# Patient Record
Sex: Male | Born: 1945 | Race: White | Hispanic: No | Smoking: Former smoker
Health system: Southern US, Community
[De-identification: ages and names within clinical notes are randomized; demographics above are authoritative.]

## PROBLEM LIST (undated history)

## (undated) DIAGNOSIS — Z8711 Personal history of peptic ulcer disease: Secondary | ICD-10-CM

## (undated) DIAGNOSIS — N183 Chronic kidney disease, stage 3 unspecified: Secondary | ICD-10-CM

## (undated) DIAGNOSIS — I208 Other forms of angina pectoris: Secondary | ICD-10-CM

## (undated) DIAGNOSIS — I10 Essential (primary) hypertension: Secondary | ICD-10-CM

## (undated) DIAGNOSIS — K219 Gastro-esophageal reflux disease without esophagitis: Secondary | ICD-10-CM

## (undated) DIAGNOSIS — Z8719 Personal history of other diseases of the digestive system: Secondary | ICD-10-CM

## (undated) DIAGNOSIS — I739 Peripheral vascular disease, unspecified: Secondary | ICD-10-CM

## (undated) DIAGNOSIS — E785 Hyperlipidemia, unspecified: Secondary | ICD-10-CM

## (undated) DIAGNOSIS — I70301 Unspecified atherosclerosis of unspecified type of bypass graft(s) of the extremities, right leg: Secondary | ICD-10-CM

## (undated) DIAGNOSIS — I70211 Atherosclerosis of native arteries of extremities with intermittent claudication, right leg: Secondary | ICD-10-CM

## (undated) HISTORY — PX: EXCISIONAL HEMORRHOIDECTOMY: SHX1541

## (undated) HISTORY — DX: Unspecified atherosclerosis of unspecified type of bypass graft(s) of the extremities, right leg: I70.301

## (undated) HISTORY — DX: Gastro-esophageal reflux disease without esophagitis: K21.9

## (undated) HISTORY — DX: Atherosclerosis of native arteries of extremities with intermittent claudication, right leg: I70.211

## (undated) HISTORY — DX: Essential (primary) hypertension: I10

## (undated) HISTORY — PX: COLONOSCOPY W/ BIOPSIES AND POLYPECTOMY: SHX1376

---

## 2003-09-01 HISTORY — PX: FEMORAL ARTERY - FEMORAL ARTERY BYPASS GRAFT: SUR179

## 2013-02-06 DIAGNOSIS — E785 Hyperlipidemia, unspecified: Secondary | ICD-10-CM

## 2013-02-06 HISTORY — DX: Hyperlipidemia, unspecified: E78.5

## 2013-08-14 DIAGNOSIS — I739 Peripheral vascular disease, unspecified: Secondary | ICD-10-CM | POA: Diagnosis present

## 2014-07-31 ENCOUNTER — Encounter: Payer: Self-pay | Admitting: Vascular Surgery

## 2014-08-01 ENCOUNTER — Encounter: Payer: Self-pay | Admitting: Vascular Surgery

## 2014-08-02 ENCOUNTER — Ambulatory Visit (INDEPENDENT_AMBULATORY_CARE_PROVIDER_SITE_OTHER): Payer: Medicare Other | Admitting: Vascular Surgery

## 2014-08-02 ENCOUNTER — Other Ambulatory Visit: Payer: Self-pay

## 2014-08-02 ENCOUNTER — Encounter: Payer: Self-pay | Admitting: Vascular Surgery

## 2014-08-02 VITALS — BP 170/97 | HR 92 | Resp 18 | Ht 72.5 in | Wt 190.5 lb

## 2014-08-02 DIAGNOSIS — I739 Peripheral vascular disease, unspecified: Secondary | ICD-10-CM

## 2014-08-02 NOTE — Progress Notes (Signed)
Patient name: David KaufmanClyde Hupp MRN: [id number] DOB: 06/22/1946 Sex: male   Referred by: Georgiana ShoreLininger  Reason for referral:  Chief Complaint  Patient presents with  . 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 Harvel QualeLiniger   . PVD    HISTORY OF PRESENT ILLNESS: Patient is a very pleasant active 68 year old gentleman with a history 10 years ago of a left to right femoral-femoral bypass for right iliac occlusion. He done extremely well since that time. He reports that one month ago he had the acute onset of recurrent claudication in his right leg. He reports that he was putting up a deer stand and noticed recurrent difficulty. He has had claudication since that time but has had no rest pain and tissue loss. This is quite limiting to him as he is extremely active. He reports discomfort in his thigh and calf which is relieved with rest.  Past Medical History  Diagnosis Date  . Atherosclerosis of native artery of right lower extremity with intermittent claudication   . 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 Esophageal reflux   . Hypertension     Past Surgical History  Procedure Laterality Date  . Femoral artery - femoral artery bypass graft  2005    Left to Right     History   Social History  . Marital Status: Unknown    Spouse Name: N/A    Number of Children: N/A  . Years of Education: N/A   Occupational History  . Not on file.   Social History Main Topics  . Smoking status: Former Smoker    Quit date: 08/31/2000  . Smokeless tobacco: Never Used  . Alcohol Use: No  . Drug Use: No  . Sexual Activity: Not on file   Other Topics Concern  . Not on file   Social History Narrative    Family History  Problem Relation Age of Onset  . Deep vein thrombosis Sister     Blood Clots  . CVA Mother   . Heart disease Mother     Allergies as of 08/02/2014  . (No Known Allergies)    Current Outpatient  Prescriptions on File Prior to Visit  Medication Sig Dispense Refill  . AMLODIPINE BESYLATE PO Take 10 mg by mouth.    Marland Kitchen. aspirin 81 MG tablet Take 81 mg by mouth daily.    Marland Kitchen. CLOPIDOGREL BISULFATE PO Take 75 mg by mouth daily.    Marland Kitchen. LOSARTAN POTASSIUM PO Take 50 mg by mouth.    . Multiple Vitamins-Minerals (CENTRUM SILVER PO) Take by mouth daily.    . Omega-3 Fatty Acids (FISH OIL) 1000 MG CAPS Take 4,000 mg by mouth daily.     Marland Kitchen. omeprazole (PRILOSEC) 20 MG capsule Take 20 mg by mouth daily.     No current facility-administered medications on file prior to visit.     REVIEW OF SYSTEMS:  Positives indicated with an "X"  CARDIOVASCULAR:  [ ]  chest pain   [ ]  chest pressure   [ ]  palpitations   [ ]  orthopnea   [ ]  dyspnea on exertion   [x ] claudication   [ ]  rest pain   [ ]  DVT   [ ]  phlebitis PULMONARY:   [ ]  productive cough   [ ]  asthma   [ ]  wheezing NEUROLOGIC:   [x ] weakness  [ ]  paresthesias  [ ]  aphasia  [ ]   amaurosis  [ ] dizziness HEMATOLOGIC:   [ ] bleeding problems   [ ] clotting disorders MUSCULOSKELETAL:  [ ] joint pain   [ ] joint swelling GASTROINTESTINAL: [x ]  blood in stool  [ ]  hematemesis GENITOURINARY:  [ ]  dysuria  [ ]  hematuria PSYCHIATRIC:  [ ] history of major depression INTEGUMENTARY:  [ ] rashes  [ ] ulcers CONSTITUTIONAL:  [ ] fever   [ ] chills  PHYSICAL EXAMINATION:  General: The patient is a well-nourished male, in no acute distress. Vital signs are BP 170/97 mmHg  Pulse 92  Resp 18  Ht 6' 0.5" (1.842 m)  Wt 190 lb 8 oz (86.41 kg)  BMI 25.47 kg/m2 Pulmonary: There is a good air exchange bilaterally without wheezing or rales. Abdomen: Soft and non-tender with normal pitch bowel sounds. Musculoskeletal: There are no major deformities.  There is no significant extremity pain. Neurologic: No focal weakness or paresthesias are detected, Skin: There are no ulcer or rashes noted. Psychiatric: The patient has normal affect. Cardiovascular: There is  a regular rate and rhythm without significant murmur appreciated. Pulse status: 2+ radial pulses bilaterally. 2+ left femoral left popliteal and 2+ left dorsalis pedis pulse. Absent right femoral popliteal and absent right pedal pulses   Vascular Lab Studies:  I reviewed his noninvasive studies from 07/18/2014. This does show medical arm index of 0.50 on the right and normal at 1.0 on the left  Impression and Plan:  Right leg claudication with occlusion of prior paced left to right fem-fem bypass. Out of her long discussion with the patient and his wife present. Explained that there is a expected failure rate of fem-fem bypasses. Explain the expected 80% and your patency with these. He clearly has had reocclusion of this approximately one month ago. I have recommended arteriography for further evaluation. I explained that he may have inflow issues regarding his left iliac system but this does not appear to be the case but noninvasive studies and physical exam. Expected he will be an excellent candidate for redo left to right fem-fem bypass. He will undergo outpatient arteriography next week at time the hospital and we will schedule elective surgery following this.    EARLY, TODD [facility] [place] Office: [phone]         

## 2014-08-06 ENCOUNTER — Encounter (HOSPITAL_COMMUNITY)
Admission: RE | Disposition: A | Discharge status: Home / Self Care | Payer: Self-pay | Source: Ambulatory Visit | Attending: Vascular Surgery

## 2014-08-06 ENCOUNTER — Ambulatory Visit (HOSPITAL_COMMUNITY)
Admission: RE | Admit: 2014-08-06 | Discharge: 2014-08-06 | Disposition: A | Discharge status: Home / Self Care | Payer: Medicare Other | Source: Ambulatory Visit | Attending: Vascular Surgery | Admitting: Vascular Surgery

## 2014-08-06 DIAGNOSIS — Z7982 Long term (current) use of aspirin: Secondary | ICD-10-CM | POA: Insufficient documentation

## 2014-08-06 DIAGNOSIS — Z87891 Personal history of nicotine dependence: Secondary | ICD-10-CM | POA: Insufficient documentation

## 2014-08-06 DIAGNOSIS — K219 Gastro-esophageal reflux disease without esophagitis: Secondary | ICD-10-CM | POA: Insufficient documentation

## 2014-08-06 DIAGNOSIS — I779 Disorder of arteries and arterioles, unspecified: Secondary | ICD-10-CM | POA: Insufficient documentation

## 2014-08-06 DIAGNOSIS — I10 Essential (primary) hypertension: Secondary | ICD-10-CM | POA: Diagnosis not present

## 2014-08-06 DIAGNOSIS — I739 Peripheral vascular disease, unspecified: Secondary | ICD-10-CM | POA: Diagnosis present

## 2014-08-06 DIAGNOSIS — I70311 Atherosclerosis of unspecified type of bypass graft(s) of the extremities with intermittent claudication, right leg: Secondary | ICD-10-CM | POA: Insufficient documentation

## 2014-08-06 DIAGNOSIS — T82898A Other specified complication of vascular prosthetic devices, implants and grafts, initial encounter: Secondary | ICD-10-CM

## 2014-08-06 HISTORY — PX: ABDOMINAL AORTAGRAM: SHX5454

## 2014-08-06 LAB — POCT I-STAT, CHEM 8
BUN: 20 mg/dL (ref 6–23)
CHLORIDE: 108 meq/L (ref 96–112)
CREATININE: 1.4 mg/dL — AB (ref 0.50–1.35)
Calcium, Ion: 1.22 mmol/L (ref 1.13–1.30)
Glucose, Bld: 106 mg/dL — ABNORMAL HIGH (ref 70–99)
HEMATOCRIT: 44 % (ref 39.0–52.0)
Hemoglobin: 15 g/dL (ref 13.0–17.0)
POTASSIUM: 3.9 meq/L (ref 3.7–5.3)
Sodium: 145 mEq/L (ref 137–147)
TCO2: 23 mmol/L (ref 0–100)

## 2014-08-06 SURGERY — ABDOMINAL AORTAGRAM
Anesthesia: LOCAL

## 2014-08-06 MED ORDER — HEPARIN (PORCINE) IN NACL 2-0.9 UNIT/ML-% IJ SOLN
INTRAMUSCULAR | Status: AC
  Filled 2014-08-06: qty 1000

## 2014-08-06 MED ORDER — OXYCODONE-ACETAMINOPHEN 5-325 MG PO TABS: 1.0000 | ORAL_TABLET | ORAL | Status: DC | PRN

## 2014-08-06 MED ORDER — LIDOCAINE HCL (PF) 1 % IJ SOLN
INTRAMUSCULAR | Status: AC
  Filled 2014-08-06: qty 30

## 2014-08-06 MED ORDER — FENTANYL CITRATE 0.05 MG/ML IJ SOLN
INTRAMUSCULAR | Status: AC
  Filled 2014-08-06: qty 2

## 2014-08-06 MED ORDER — SODIUM CHLORIDE 0.9 % IV SOLN: 1.0000 mL/kg/h | INTRAVENOUS | Status: DC

## 2014-08-06 MED ORDER — MIDAZOLAM HCL 2 MG/2ML IJ SOLN
INTRAMUSCULAR | Status: AC
  Filled 2014-08-06: qty 2

## 2014-08-06 MED ORDER — SODIUM CHLORIDE 0.9 % IV SOLN: INTRAVENOUS | Status: DC

## 2014-08-06 NOTE — Discharge Instructions (Signed)

## 2014-08-06 NOTE — H&P (View-Only) (Signed)
Patient name: David KaufmanClyde Horne MRN: [id number] DOB: 06/22/1946 Sex: male   Referred by: Georgiana ShoreLininger  Reason for referral:  Chief Complaint  Patient presents with  . 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 Harvel QualeLiniger   . PVD    HISTORY OF PRESENT ILLNESS: Patient is a very pleasant active 68 year old gentleman with a history 10 years ago of a left to right femoral-femoral bypass for right iliac occlusion. He done extremely well since that time. He reports that one month ago he had the acute onset of recurrent claudication in his right leg. He reports that he was putting up a deer stand and noticed recurrent difficulty. He has had claudication since that time but has had no rest pain and tissue loss. This is quite limiting to him as he is extremely active. He reports discomfort in his thigh and calf which is relieved with rest.  Past Medical History  Diagnosis Date  . Atherosclerosis of native artery of right lower extremity with intermittent claudication   . 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 Esophageal reflux   . Hypertension     Past Surgical History  Procedure Laterality Date  . Femoral artery - femoral artery bypass graft  2005    Left to Right     History   Social History  . Marital Status: Unknown    Spouse Name: N/A    Number of Children: N/A  . Years of Education: N/A   Occupational History  . Not on file.   Social History Main Topics  . Smoking status: Former Smoker    Quit date: 08/31/2000  . Smokeless tobacco: Never Used  . Alcohol Use: No  . Drug Use: No  . Sexual Activity: Not on file   Other Topics Concern  . Not on file   Social History Narrative    Family History  Problem Relation Age of Onset  . Deep vein thrombosis Sister     Blood Clots  . CVA Mother   . Heart disease Mother     Allergies as of 08/02/2014  . (No Known Allergies)    Current Outpatient  Prescriptions on File Prior to Visit  Medication Sig Dispense Refill  . AMLODIPINE BESYLATE PO Take 10 mg by mouth.    Marland Kitchen. aspirin 81 MG tablet Take 81 mg by mouth daily.    Marland Kitchen. CLOPIDOGREL BISULFATE PO Take 75 mg by mouth daily.    Marland Kitchen. LOSARTAN POTASSIUM PO Take 50 mg by mouth.    . Multiple Vitamins-Minerals (CENTRUM SILVER PO) Take by mouth daily.    . Omega-3 Fatty Acids (FISH OIL) 1000 MG CAPS Take 4,000 mg by mouth daily.     Marland Kitchen. omeprazole (PRILOSEC) 20 MG capsule Take 20 mg by mouth daily.     No current facility-administered medications on file prior to visit.     REVIEW OF SYSTEMS:  Positives indicated with an "X"  CARDIOVASCULAR:  [ ]  chest pain   [ ]  chest pressure   [ ]  palpitations   [ ]  orthopnea   [ ]  dyspnea on exertion   [x ] claudication   [ ]  rest pain   [ ]  DVT   [ ]  phlebitis PULMONARY:   [ ]  productive cough   [ ]  asthma   [ ]  wheezing NEUROLOGIC:   [x ] weakness  [ ]  paresthesias  [ ]  aphasia  [ ]   amaurosis  [ ]  dizziness HEMATOLOGIC:   [ ]  bleeding problems   [ ]  clotting disorders MUSCULOSKELETAL:  [ ]  joint pain   [ ]  joint swelling GASTROINTESTINAL: [x ]  blood in stool  [ ]   hematemesis GENITOURINARY:  [ ]   dysuria  [ ]   hematuria PSYCHIATRIC:  [ ]  history of major depression INTEGUMENTARY:  [ ]  rashes  [ ]  ulcers CONSTITUTIONAL:  [ ]  fever   [ ]  chills  PHYSICAL EXAMINATION:  General: The patient is a well-nourished male, in no acute distress. Vital signs are BP 170/97 mmHg  Pulse 92  Resp 18  Ht 6' 0.5" (1.842 m)  Wt 190 lb 8 oz (86.41 kg)  BMI 25.47 kg/m2 Pulmonary: There is a good air exchange bilaterally without wheezing or rales. Abdomen: Soft and non-tender with normal pitch bowel sounds. Musculoskeletal: There are no major deformities.  There is no significant extremity pain. Neurologic: No focal weakness or paresthesias are detected, Skin: There are no ulcer or rashes noted. Psychiatric: The patient has normal affect. Cardiovascular: There is  a regular rate and rhythm without significant murmur appreciated. Pulse status: 2+ radial pulses bilaterally. 2+ left femoral left popliteal and 2+ left dorsalis pedis pulse. Absent right femoral popliteal and absent right pedal pulses   Vascular Lab Studies:  I reviewed his noninvasive studies from 07/18/2014. This does show medical arm index of 0.50 on the right and normal at 1.0 on the left  Impression and Plan:  Right leg claudication with occlusion of prior paced left to right fem-fem bypass. Out of her long discussion with the patient and his wife present. Explained that there is a expected failure rate of fem-fem bypasses. Explain the expected 80% and your patency with these. He clearly has had reocclusion of this approximately one month ago. I have recommended arteriography for further evaluation. I explained that he may have inflow issues regarding his left iliac system but this does not appear to be the case but noninvasive studies and physical exam. Expected he will be an excellent candidate for redo left to right fem-fem bypass. He will undergo outpatient arteriography next week at time the hospital and we will schedule elective surgery following this.    David Horne [facility] [place] Office: [phone]

## 2014-08-06 NOTE — Op Note (Signed)
   David Horne: [name]   MRN: [id number] DOB: 01/19/1946    DATE OF PROCEDURE: 08/06/2014  INDICATIONS: David KaufmanClyde Treat is a 68 y.o. male who presented with an occluded left-to-right femorofemoral bypass graft. He was set up for a diagnostic arteriogram in order to plan for redo femorofemoral bypass grafting.  PROCEDURE:  1. Ultrasound-guided access to the left common femoral artery 2. Aortogram with bilateral iliac arteriogram and bilateral lower extremity runoff  SURGEON: Di Kindlehristopher S. Edilia Boickson, MD, FACS  ANESTHESIA: local with sedation   EBL: minimal  TECHNIQUE: The patient was taken to the peripheral vascular lab and received 1 mg of Versed and 50 g of fentanyl. Both groins were prepped and draped in usual sterile fashion. Under ultrasound guidance, after the skin was anesthetized, I attempted to cannulate the common femoral artery above the anastomosis however was difficult to get an appropriate angle because of the overlying graft. I was able to cannulate the artery above the graft but the wire would not pass and therefore I held pressure and elected to cannulate the anastomosis through the graft. Under ultrasound guidance, after the skin was anesthetized, the micropuncture needle was used to cannulate the native common femoral artery through the graft. The micropuncture sheath was introduced over a wire and then the micropuncture sheath exchanged for a 5 French sheath over a versa core wire. The pigtail catheter was positioned at the L1 vertebral body and flush aortogram obtained. The catheter was in position above the aortic bifurcation and oblique iliac projection was obtained. Next bilateral lower extremity runoff films were obtained. At the completion of the procedure the pigtail catheter was removed over a wire. The sheath was removed and pressure held for hemostasis. No immediate complications were noted.  FINDINGS:  1. There are single renal arteries bilaterally with no significant renal  artery stenosis identified. 2. The infrarenal aorta is patent. The left common iliac artery is patent although the right common iliac artery is occluded. The right external iliac artery is occluded. 3. On the right side, which is the symptomatic side, there is reconstitution of the distal common femoral artery. The right deep femoral artery, superficial femoral artery, popliteal artery, and proximal anterior tibial, posterior tibial, tibial peroneal trunk, and peroneal arteries are patent. There is poor visualization of the right leg distally because of the proximal occlusion. 4. On the left side, the common iliac artery and external iliac arteries are widely patent. The left common femoral, deep femoral, superficial femoral, popliteal, anterior tibial, posterior tibial, tibial peroneal trunk, and peroneal arteries are patent.  [name], MD, FACS [facility]  DATE OF DICTATION:   08/06/2014

## 2014-08-06 NOTE — Interval H&P Note (Signed)
History and Physical Interval Note:  08/06/2014 10:19 AM  David Horne  has presented today for surgery, with the diagnosis of right illac acclution  The various methods of treatment have been discussed with the patient and family. After consideration of risks, benefits and other options for treatment, the patient has consented to  Procedure(s): ABDOMINAL AORTAGRAM (N/A) as a surgical intervention . His airway is a 2. The patient's history has been reviewed, patient examined, no change in status, stable for surgery.  I have reviewed the patient's chart and labs.  Questions were answered to the patient's satisfaction.     DICKSON,CHRISTOPHER S

## 2014-08-09 ENCOUNTER — Encounter (HOSPITAL_COMMUNITY): Payer: Self-pay | Admitting: Vascular Surgery

## 2014-08-14 ENCOUNTER — Other Ambulatory Visit: Payer: Self-pay

## 2014-08-15 ENCOUNTER — Other Ambulatory Visit: Payer: Self-pay

## 2014-08-21 ENCOUNTER — Encounter: Payer: Self-pay | Admitting: Vascular Surgery

## 2014-09-03 ENCOUNTER — Other Ambulatory Visit (HOSPITAL_COMMUNITY): Payer: Self-pay | Admitting: *Deleted

## 2014-09-03 NOTE — Pre-Procedure Instructions (Signed)
Mohmed Farver  09/03/2014   Your procedure is scheduled on:  Monday, September 10, 2014 at 7:30 AM.   Report to [facility] Entrance "A" Admitting Office at 5:30 AM.   Call this number if you have problems the morning of surgery: [phone]               Any questions prior to day of surgery, please call [phone] between 8 & 4 PM.   Remember:   Do not eat food or drink liquids after midnight Sunday, 09/09/14.   Take these medicines the morning of surgery with A SIP OF WATER: amLODipine (NORVASC), omeprazole (PRILOSEC).  Stop Aspirin, Plavix, Vitamins, and Fish Oil as of today.   Do not wear jewelry.  Do not wear lotions, powders, or cologne. You may wear deodorant.  Men may shave face and neck.  Do not bring valuables to the hospital.  [facility] is not responsible                  for any belongings or valuables.               Contacts, dentures or bridgework may not be worn into surgery.  Leave suitcase in the car. After surgery it may be brought to your room.  For patients admitted to the hospital, discharge time is determined by your                treatment team.          Special Instructions: See "Preparing for Surgery" Instruction Sheet.   Please read over the following fact sheets that you were given: Pain Booklet, Coughing and Deep Breathing, Blood Transfusion Information, MRSA Information and Surgical Site Infection Prevention

## 2014-09-04 ENCOUNTER — Encounter (HOSPITAL_COMMUNITY)
Admission: RE | Admit: 2014-09-04 | Discharge: 2014-09-04 | Disposition: A | Discharge status: Home / Self Care | Payer: Medicare Other | Source: Ambulatory Visit | Attending: Vascular Surgery | Admitting: Vascular Surgery

## 2014-09-04 ENCOUNTER — Encounter (HOSPITAL_COMMUNITY): Payer: Self-pay

## 2014-09-04 DIAGNOSIS — Z01812 Encounter for preprocedural laboratory examination: Secondary | ICD-10-CM | POA: Insufficient documentation

## 2014-09-04 DIAGNOSIS — I70312 Atherosclerosis of unspecified type of bypass graft(s) of the extremities with intermittent claudication, left leg: Secondary | ICD-10-CM | POA: Insufficient documentation

## 2014-09-04 DIAGNOSIS — Z7902 Long term (current) use of antithrombotics/antiplatelets: Secondary | ICD-10-CM | POA: Diagnosis not present

## 2014-09-04 DIAGNOSIS — I10 Essential (primary) hypertension: Secondary | ICD-10-CM | POA: Diagnosis not present

## 2014-09-04 DIAGNOSIS — Z79899 Other long term (current) drug therapy: Secondary | ICD-10-CM | POA: Insufficient documentation

## 2014-09-04 LAB — COMPREHENSIVE METABOLIC PANEL
ALT: 35 U/L (ref 0–53)
ANION GAP: 9 (ref 5–15)
AST: 26 U/L (ref 0–37)
Albumin: 3.9 g/dL (ref 3.5–5.2)
Alkaline Phosphatase: 116 U/L (ref 39–117)
BUN: 14 mg/dL (ref 6–23)
CALCIUM: 9.4 mg/dL (ref 8.4–10.5)
CO2: 24 mmol/L (ref 19–32)
CREATININE: 1.59 mg/dL — AB (ref 0.50–1.35)
Chloride: 108 mEq/L (ref 96–112)
GFR calc Af Amer: 50 mL/min — ABNORMAL LOW (ref >90)
GFR, EST NON AFRICAN AMERICAN: 43 mL/min — AB (ref >90)
Glucose, Bld: 94 mg/dL (ref 70–99)
POTASSIUM: 3.7 mmol/L (ref 3.5–5.1)
Sodium: 141 mmol/L (ref 135–145)
Total Bilirubin: 0.5 mg/dL (ref 0.3–1.2)
Total Protein: 7.5 g/dL (ref 6.0–8.3)

## 2014-09-04 LAB — CBC
HCT: 46.5 % (ref 39.0–52.0)
Hemoglobin: 16 g/dL (ref 13.0–17.0)
MCH: 30.2 pg (ref 26.0–34.0)
MCHC: 34.4 g/dL (ref 30.0–36.0)
MCV: 87.9 fL (ref 78.0–100.0)
Platelets: 260 10*3/uL (ref 150–400)
RBC: 5.29 MIL/uL (ref 4.22–5.81)
RDW: 13.3 % (ref 11.5–15.5)
WBC: 7.4 10*3/uL (ref 4.0–10.5)

## 2014-09-04 LAB — URINALYSIS, ROUTINE W REFLEX MICROSCOPIC
BILIRUBIN URINE: NEGATIVE
Glucose, UA: NEGATIVE mg/dL
Hgb urine dipstick: NEGATIVE
KETONES UR: NEGATIVE mg/dL
LEUKOCYTES UA: NEGATIVE
Nitrite: NEGATIVE
PH: 6.5 (ref 5.0–8.0)
Protein, ur: NEGATIVE mg/dL
SPECIFIC GRAVITY, URINE: 1.019 (ref 1.005–1.030)
UROBILINOGEN UA: 1 mg/dL (ref 0.0–1.0)

## 2014-09-04 LAB — PROTIME-INR
INR: 1.03 (ref 0.00–1.49)
Prothrombin Time: 13.6 seconds (ref 11.6–15.2)

## 2014-09-04 LAB — TYPE AND SCREEN
ABO/RH(D): A POS
Antibody Screen: NEGATIVE

## 2014-09-04 LAB — ABO/RH: ABO/RH(D): A POS

## 2014-09-04 LAB — SURGICAL PCR SCREEN
MRSA, PCR: NEGATIVE
STAPHYLOCOCCUS AUREUS: NEGATIVE

## 2014-09-04 LAB — APTT: aPTT: 27 seconds (ref 24–37)

## 2014-09-04 NOTE — Progress Notes (Signed)
LEFT MESSAGE FOR DR. Bosie HelperEARLY'S OFFICE TO INSTRUCT PATIENT ON WHETHER TO STOP ASPIRIN AND PATIENT STATED HE WAS INSTRUCTED TO STOP PLAVIX TODAY.

## 2014-09-09 MED ORDER — DEXTROSE 5 % IV SOLN
1.5000 g | INTRAVENOUS | Status: AC
  Administered 2014-09-10: 1.5 g via INTRAVENOUS
  Filled 2014-09-09: qty 1.5

## 2014-09-10 ENCOUNTER — Encounter (HOSPITAL_COMMUNITY): Payer: Self-pay | Admitting: *Deleted

## 2014-09-10 ENCOUNTER — Encounter (HOSPITAL_COMMUNITY)
Admission: RE | Disposition: A | Discharge status: Home / Self Care | Payer: Self-pay | Source: Ambulatory Visit | Attending: Vascular Surgery

## 2014-09-10 ENCOUNTER — Inpatient Hospital Stay (HOSPITAL_COMMUNITY)
Admission: RE | Admit: 2014-09-10 | Discharge: 2014-09-12 | DRG: 253 | Disposition: A | Discharge status: Home / Self Care | Payer: Medicare Other | Source: Ambulatory Visit | Attending: Vascular Surgery | Admitting: Vascular Surgery

## 2014-09-10 ENCOUNTER — Inpatient Hospital Stay (HOSPITAL_COMMUNITY): Payer: Medicare Other | Admitting: Anesthesiology

## 2014-09-10 DIAGNOSIS — Z87891 Personal history of nicotine dependence: Secondary | ICD-10-CM | POA: Diagnosis not present

## 2014-09-10 DIAGNOSIS — I70618 Atherosclerosis of nonbiological bypass graft(s) of the extremities with intermittent claudication, other extremity: Principal | ICD-10-CM | POA: Diagnosis present

## 2014-09-10 DIAGNOSIS — I7092 Chronic total occlusion of artery of the extremities: Secondary | ICD-10-CM | POA: Diagnosis present

## 2014-09-10 DIAGNOSIS — I70411 Atherosclerosis of autologous vein bypass graft(s) of the extremities with intermittent claudication, right leg: Secondary | ICD-10-CM

## 2014-09-10 DIAGNOSIS — I10 Essential (primary) hypertension: Secondary | ICD-10-CM | POA: Diagnosis present

## 2014-09-10 DIAGNOSIS — K219 Gastro-esophageal reflux disease without esophagitis: Secondary | ICD-10-CM | POA: Diagnosis present

## 2014-09-10 DIAGNOSIS — T82898A Other specified complication of vascular prosthetic devices, implants and grafts, initial encounter: Secondary | ICD-10-CM | POA: Diagnosis present

## 2014-09-10 HISTORY — PX: FEMORAL-FEMORAL BYPASS GRAFT: SHX936

## 2014-09-10 LAB — CBC
HCT: 37.1 % — ABNORMAL LOW (ref 39.0–52.0)
Hemoglobin: 12.6 g/dL — ABNORMAL LOW (ref 13.0–17.0)
MCH: 29.4 pg (ref 26.0–34.0)
MCHC: 34 g/dL (ref 30.0–36.0)
MCV: 86.7 fL (ref 78.0–100.0)
Platelets: 229 10*3/uL (ref 150–400)
RBC: 4.28 MIL/uL (ref 4.22–5.81)
RDW: 13.2 % (ref 11.5–15.5)
WBC: 11.1 10*3/uL — AB (ref 4.0–10.5)

## 2014-09-10 LAB — CREATININE, SERUM
CREATININE: 1.36 mg/dL — AB (ref 0.50–1.35)
GFR, EST AFRICAN AMERICAN: 60 mL/min — AB (ref >90)
GFR, EST NON AFRICAN AMERICAN: 52 mL/min — AB (ref >90)

## 2014-09-10 SURGERY — CREATION, BYPASS, ARTERIAL, FEMORAL TO FEMORAL, USING GRAFT
Anesthesia: General | Site: Groin | Laterality: Bilateral

## 2014-09-10 MED ORDER — LIDOCAINE HCL (CARDIAC) 20 MG/ML IV SOLN
INTRAVENOUS | Status: AC
  Filled 2014-09-10: qty 5

## 2014-09-10 MED ORDER — NEOSTIGMINE METHYLSULFATE 10 MG/10ML IV SOLN
INTRAVENOUS | Status: DC | PRN
Start: 2014-09-10 — End: 2014-09-10
  Administered 2014-09-10: 3 mg via INTRAVENOUS

## 2014-09-10 MED ORDER — FENTANYL CITRATE 0.05 MG/ML IJ SOLN
INTRAMUSCULAR | Status: DC | PRN
  Administered 2014-09-10: 50 ug via INTRAVENOUS
  Administered 2014-09-10: 100 ug via INTRAVENOUS
  Administered 2014-09-10 (×2): 25 ug via INTRAVENOUS

## 2014-09-10 MED ORDER — NEOSTIGMINE METHYLSULFATE 10 MG/10ML IV SOLN
INTRAVENOUS | Status: AC
  Filled 2014-09-10: qty 1

## 2014-09-10 MED ORDER — HYDROMORPHONE HCL 1 MG/ML IJ SOLN
0.2500 mg | INTRAMUSCULAR | Status: DC | PRN
  Administered 2014-09-10: 0.5 mg via INTRAVENOUS

## 2014-09-10 MED ORDER — HEPARIN SODIUM (PORCINE) 1000 UNIT/ML IJ SOLN
INTRAMUSCULAR | Status: AC
  Filled 2014-09-10: qty 1

## 2014-09-10 MED ORDER — ENOXAPARIN SODIUM 30 MG/0.3ML ~~LOC~~ SOLN
30.0000 mg | SUBCUTANEOUS | Status: DC
  Administered 2014-09-11 – 2014-09-12 (×2): 30 mg via SUBCUTANEOUS
  Filled 2014-09-10 (×2): qty 0.3

## 2014-09-10 MED ORDER — PROPOFOL 10 MG/ML IV BOLUS
INTRAVENOUS | Status: AC
  Filled 2014-09-10: qty 20

## 2014-09-10 MED ORDER — LABETALOL HCL 5 MG/ML IV SOLN
10.0000 mg | INTRAVENOUS | Status: DC | PRN
  Filled 2014-09-10: qty 4

## 2014-09-10 MED ORDER — PHENOL 1.4 % MT LIQD
1.0000 | OROMUCOSAL | Status: DC | PRN
  Filled 2014-09-10: qty 177

## 2014-09-10 MED ORDER — SODIUM CHLORIDE 0.9 % IR SOLN
Status: DC | PRN
  Administered 2014-09-10: 500 mL

## 2014-09-10 MED ORDER — POTASSIUM CHLORIDE CRYS ER 20 MEQ PO TBCR
20.0000 meq | EXTENDED_RELEASE_TABLET | Freq: Every day | ORAL | Status: DC | PRN
Start: 2014-09-10 — End: 2014-09-12

## 2014-09-10 MED ORDER — POLYETHYLENE GLYCOL 3350 17 G PO PACK
17.0000 g | PACK | Freq: Every day | ORAL | Status: DC | PRN
  Filled 2014-09-10: qty 1

## 2014-09-10 MED ORDER — DOCUSATE SODIUM 100 MG PO CAPS
100.0000 mg | ORAL_CAPSULE | Freq: Every day | ORAL | Status: DC
  Administered 2014-09-11 – 2014-09-12 (×2): 100 mg via ORAL
  Filled 2014-09-10 (×2): qty 1

## 2014-09-10 MED ORDER — CLOPIDOGREL BISULFATE 75 MG PO TABS
75.0000 mg | ORAL_TABLET | Freq: Every day | ORAL | Status: DC
  Administered 2014-09-10 – 2014-09-12 (×3): 75 mg via ORAL
  Filled 2014-09-10 (×3): qty 1

## 2014-09-10 MED ORDER — SODIUM CHLORIDE 0.9 % IV SOLN: 500.0000 mL | Freq: Once | INTRAVENOUS | Status: AC | PRN

## 2014-09-10 MED ORDER — AMLODIPINE BESYLATE 10 MG PO TABS
10.0000 mg | ORAL_TABLET | Freq: Every day | ORAL | Status: DC
Start: 2014-09-11 — End: 2014-09-12
  Administered 2014-09-11 – 2014-09-12 (×2): 10 mg via ORAL
  Filled 2014-09-10 (×2): qty 1

## 2014-09-10 MED ORDER — SODIUM CHLORIDE 0.9 % IV SOLN: INTRAVENOUS | Status: DC

## 2014-09-10 MED ORDER — CHLORHEXIDINE GLUCONATE CLOTH 2 % EX PADS: 6.0000 | MEDICATED_PAD | Freq: Once | CUTANEOUS | Status: DC

## 2014-09-10 MED ORDER — PROTAMINE SULFATE 10 MG/ML IV SOLN
INTRAVENOUS | Status: DC | PRN
  Administered 2014-09-10: 20 mg via INTRAVENOUS
  Administered 2014-09-10: 30 mg via INTRAVENOUS

## 2014-09-10 MED ORDER — MORPHINE SULFATE 2 MG/ML IJ SOLN
2.0000 mg | INTRAMUSCULAR | Status: DC | PRN
Start: 2014-09-10 — End: 2014-09-12

## 2014-09-10 MED ORDER — ASPIRIN 81 MG PO CHEW
81.0000 mg | CHEWABLE_TABLET | Freq: Every day | ORAL | Status: DC
  Administered 2014-09-11 – 2014-09-12 (×2): 81 mg via ORAL
  Filled 2014-09-10 (×2): qty 1

## 2014-09-10 MED ORDER — OXYCODONE-ACETAMINOPHEN 5-325 MG PO TABS
1.0000 | ORAL_TABLET | ORAL | Status: DC | PRN
  Administered 2014-09-11: 2 via ORAL
  Filled 2014-09-10: qty 2

## 2014-09-10 MED ORDER — HEPARIN SODIUM (PORCINE) 1000 UNIT/ML IJ SOLN
INTRAMUSCULAR | Status: DC | PRN
  Administered 2014-09-10: 8000 [IU] via INTRAVENOUS

## 2014-09-10 MED ORDER — MIDAZOLAM HCL 2 MG/2ML IJ SOLN
INTRAMUSCULAR | Status: AC
  Filled 2014-09-10: qty 2

## 2014-09-10 MED ORDER — DEXTROSE 5 % IV SOLN
1.5000 g | Freq: Two times a day (BID) | INTRAVENOUS | Status: AC
  Administered 2014-09-11: 1.5 g via INTRAVENOUS
  Filled 2014-09-10 (×3): qty 1.5

## 2014-09-10 MED ORDER — ASPIRIN 81 MG PO TABS
81.0000 mg | ORAL_TABLET | Freq: Every day | ORAL | Status: DC
  Filled 2014-09-10: qty 1

## 2014-09-10 MED ORDER — PANTOPRAZOLE SODIUM 40 MG PO TBEC
40.0000 mg | DELAYED_RELEASE_TABLET | Freq: Every day | ORAL | Status: DC
  Administered 2014-09-10 – 2014-09-12 (×3): 40 mg via ORAL
  Filled 2014-09-10 (×2): qty 1

## 2014-09-10 MED ORDER — BISACODYL 10 MG RE SUPP: 10.0000 mg | Freq: Every day | RECTAL | Status: DC | PRN

## 2014-09-10 MED ORDER — HYDROMORPHONE HCL 1 MG/ML IJ SOLN
INTRAMUSCULAR | Status: AC
  Filled 2014-09-10: qty 1

## 2014-09-10 MED ORDER — LIDOCAINE HCL (CARDIAC) 20 MG/ML IV SOLN
INTRAVENOUS | Status: DC | PRN
  Administered 2014-09-10: 70 mg via INTRAVENOUS

## 2014-09-10 MED ORDER — METOPROLOL TARTRATE 1 MG/ML IV SOLN: 2.0000 mg | INTRAVENOUS | Status: DC | PRN

## 2014-09-10 MED ORDER — GLYCOPYRROLATE 0.2 MG/ML IJ SOLN
INTRAMUSCULAR | Status: AC
  Filled 2014-09-10: qty 2

## 2014-09-10 MED ORDER — GUAIFENESIN-DM 100-10 MG/5ML PO SYRP: 15.0000 mL | ORAL_SOLUTION | ORAL | Status: DC | PRN

## 2014-09-10 MED ORDER — ALUM & MAG HYDROXIDE-SIMETH 200-200-20 MG/5ML PO SUSP: 15.0000 mL | ORAL | Status: DC | PRN

## 2014-09-10 MED ORDER — 0.9 % SODIUM CHLORIDE (POUR BTL) OPTIME
TOPICAL | Status: DC | PRN
  Administered 2014-09-10 (×2): 1000 mL

## 2014-09-10 MED ORDER — ACETAMINOPHEN 325 MG PO TABS
325.0000 mg | ORAL_TABLET | ORAL | Status: DC | PRN
Start: 2014-09-10 — End: 2014-09-12

## 2014-09-10 MED ORDER — ONDANSETRON HCL 4 MG/2ML IJ SOLN
INTRAMUSCULAR | Status: AC
  Filled 2014-09-10: qty 2

## 2014-09-10 MED ORDER — GLYCOPYRROLATE 0.2 MG/ML IJ SOLN
INTRAMUSCULAR | Status: DC | PRN
  Administered 2014-09-10: 0.4 mg via INTRAVENOUS

## 2014-09-10 MED ORDER — HYDRALAZINE HCL 20 MG/ML IJ SOLN: 5.0000 mg | INTRAMUSCULAR | Status: DC | PRN

## 2014-09-10 MED ORDER — ONDANSETRON HCL 4 MG/2ML IJ SOLN
INTRAMUSCULAR | Status: DC | PRN
  Administered 2014-09-10: 4 mg via INTRAVENOUS

## 2014-09-10 MED ORDER — LACTATED RINGERS IV SOLN
INTRAVENOUS | Status: DC | PRN
  Administered 2014-09-10: 07:00:00 via INTRAVENOUS

## 2014-09-10 MED ORDER — PHENYLEPHRINE HCL 10 MG/ML IJ SOLN
10.0000 mg | INTRAMUSCULAR | Status: DC | PRN
  Administered 2014-09-10: 10 ug/min via INTRAVENOUS

## 2014-09-10 MED ORDER — ACETAMINOPHEN 650 MG RE SUPP: 325.0000 mg | RECTAL | Status: DC | PRN

## 2014-09-10 MED ORDER — MAGNESIUM SULFATE 2 GM/50ML IV SOLN
2.0000 g | Freq: Every day | INTRAVENOUS | Status: DC | PRN
  Filled 2014-09-10: qty 50

## 2014-09-10 MED ORDER — FENTANYL CITRATE 0.05 MG/ML IJ SOLN
INTRAMUSCULAR | Status: AC
  Filled 2014-09-10: qty 5

## 2014-09-10 MED ORDER — ONDANSETRON HCL 4 MG/2ML IJ SOLN
4.0000 mg | Freq: Four times a day (QID) | INTRAMUSCULAR | Status: DC | PRN
  Administered 2014-09-10 – 2014-09-11 (×2): 4 mg via INTRAVENOUS
  Filled 2014-09-10 (×3): qty 2

## 2014-09-10 MED ORDER — ROCURONIUM BROMIDE 50 MG/5ML IV SOLN
INTRAVENOUS | Status: AC
  Filled 2014-09-10: qty 1

## 2014-09-10 MED ORDER — PROPOFOL 10 MG/ML IV BOLUS
INTRAVENOUS | Status: DC | PRN
  Administered 2014-09-10: 160 mg via INTRAVENOUS

## 2014-09-10 MED ORDER — LOSARTAN POTASSIUM 50 MG PO TABS
50.0000 mg | ORAL_TABLET | Freq: Every day | ORAL | Status: DC
  Administered 2014-09-10 – 2014-09-12 (×3): 50 mg via ORAL
  Filled 2014-09-10 (×3): qty 1

## 2014-09-10 MED ORDER — ROCURONIUM BROMIDE 100 MG/10ML IV SOLN
INTRAVENOUS | Status: DC | PRN
  Administered 2014-09-10: 40 mg via INTRAVENOUS

## 2014-09-10 MED ORDER — MIDAZOLAM HCL 5 MG/5ML IJ SOLN
INTRAMUSCULAR | Status: DC | PRN
  Administered 2014-09-10: 2 mg via INTRAVENOUS

## 2014-09-10 SURGICAL SUPPLY — 52 items
BENZOIN TINCTURE PRP APPL 2/3 (GAUZE/BANDAGES/DRESSINGS) ×3 IMPLANT
CANISTER SUCTION 2500CC (MISCELLANEOUS) ×3 IMPLANT
CANNULA VESSEL 3MM 2 BLNT TIP (CANNULA) ×9 IMPLANT
CLIP LIGATING EXTRA MED SLVR (CLIP) ×3 IMPLANT
CLIP LIGATING EXTRA SM BLUE (MISCELLANEOUS) ×3 IMPLANT
CLOSURE STERI-STRIP 1/2X4 (GAUZE/BANDAGES/DRESSINGS) ×1
CLOSURE WOUND 1/2 X4 (GAUZE/BANDAGES/DRESSINGS) ×1
CLSR STERI-STRIP ANTIMIC 1/2X4 (GAUZE/BANDAGES/DRESSINGS) ×2 IMPLANT
DRAIN SNY 10X20 3/4 PERF (WOUND CARE) IMPLANT
DRSG COVADERM 4X10 (GAUZE/BANDAGES/DRESSINGS) IMPLANT
ELECT REM PT RETURN 9FT ADLT (ELECTROSURGICAL) ×3
ELECTRODE REM PT RTRN 9FT ADLT (ELECTROSURGICAL) ×1 IMPLANT
EVACUATOR SILICONE 100CC (DRAIN) IMPLANT
GLOVE BIOGEL PI IND STRL 6.5 (GLOVE) ×2 IMPLANT
GLOVE BIOGEL PI IND STRL 7.5 (GLOVE) ×1 IMPLANT
GLOVE BIOGEL PI IND STRL 8 (GLOVE) ×1 IMPLANT
GLOVE BIOGEL PI INDICATOR 6.5 (GLOVE) ×4
GLOVE BIOGEL PI INDICATOR 7.5 (GLOVE) ×2
GLOVE BIOGEL PI INDICATOR 8 (GLOVE) ×2
GLOVE ECLIPSE 7.0 STRL STRAW (GLOVE) ×3 IMPLANT
GLOVE SKINSENSE NS SZ7.0 (GLOVE) ×2
GLOVE SKINSENSE NS SZ7.5 (GLOVE) ×2
GLOVE SKINSENSE STRL SZ7.0 (GLOVE) ×1 IMPLANT
GLOVE SKINSENSE STRL SZ7.5 (GLOVE) ×1 IMPLANT
GLOVE SS BIOGEL STRL SZ 7.5 (GLOVE) ×1 IMPLANT
GLOVE SUPERSENSE BIOGEL SZ 7.5 (GLOVE) ×2
GOWN BRE IMP SLV AUR XL STRL (GOWN DISPOSABLE) ×6 IMPLANT
GOWN STRL REUS W/ TWL LRG LVL3 (GOWN DISPOSABLE) ×2 IMPLANT
GOWN STRL REUS W/TWL LRG LVL3 (GOWN DISPOSABLE) ×4
GRAFT HEMASHIELD 8MM (Vascular Products) ×2 IMPLANT
GRAFT VASC STRG 30X8KNIT (Vascular Products) ×1 IMPLANT
INSERT FOGARTY SM (MISCELLANEOUS) ×3 IMPLANT
KIT BASIN OR (CUSTOM PROCEDURE TRAY) ×3 IMPLANT
KIT ROOM TURNOVER OR (KITS) ×3 IMPLANT
NS IRRIG 1000ML POUR BTL (IV SOLUTION) ×6 IMPLANT
PACK PERIPHERAL VASCULAR (CUSTOM PROCEDURE TRAY) ×3 IMPLANT
PAD ARMBOARD 7.5X6 YLW CONV (MISCELLANEOUS) ×6 IMPLANT
PROBE PENCIL 8 MHZ STRL DISP (MISCELLANEOUS) IMPLANT
SPONGE GAUZE 4X4 12PLY STER LF (GAUZE/BANDAGES/DRESSINGS) ×3 IMPLANT
STAPLER VISISTAT 35W (STAPLE) IMPLANT
STRIP CLOSURE SKIN 1/2X4 (GAUZE/BANDAGES/DRESSINGS) ×2 IMPLANT
SUT ETHILON 3 0 PS 1 (SUTURE) IMPLANT
SUT PROLENE 5 0 C 1 24 (SUTURE) ×9 IMPLANT
SUT PROLENE 6 0 CC (SUTURE) IMPLANT
SUT SILK 2 0 SH (SUTURE) ×3 IMPLANT
SUT VIC AB 2-0 CTX 36 (SUTURE) ×6 IMPLANT
SUT VIC AB 3-0 SH 27 (SUTURE) ×4
SUT VIC AB 3-0 SH 27X BRD (SUTURE) ×2 IMPLANT
TAPE CLOTH SURG 4X10 WHT LF (GAUZE/BANDAGES/DRESSINGS) ×3 IMPLANT
TRAY FOLEY CATH 16FRSI W/METER (SET/KITS/TRAYS/PACK) ×3 IMPLANT
UNDERPAD 30X30 INCONTINENT (UNDERPADS AND DIAPERS) ×3 IMPLANT
WATER STERILE IRR 1000ML POUR (IV SOLUTION) ×3 IMPLANT

## 2014-09-10 NOTE — Op Note (Signed)
OPERATIVE REPORT  DATE OF SURGERY: 09/10/2014  PATIENT: David Horne, 69 y.o. male MRN: [id number]  DOB: 06/03/1946  PRE-OPERATIVE DIAGNOSIS: Right leg claudication with occluded left right femorofemoral bypass  POST-OPERATIVE DIAGNOSIS:  Same  PROCEDURE: Redo left to right femorofemoral bypass with 8 mm Hemashield graft  SURGEON:  David Beganodd Akina Maish, M.D.  PHYSICIAN ASSISTANT: Collins  ANESTHESIA:  Gen.  EBL: Less than 100 ml  Total I/O In: 1400 [I.V.:1400] Out: 380 [Urine:180; Blood:200]  BLOOD ADMINISTERED: None  DRAINS: None  SPECIMEN: None  COUNTS CORRECT:  YES  PLAN OF CARE: PACU stable   PATIENT DISPOSITION:  PACU - hemodynamically stable  PROCEDURE DETAILS: Patient is approximately 10 years out from a left right femorofemoral bypass graft at another facility. He was seen earlier proximally one month ago for evaluation of recurrent claudication symptoms. Noninvasive studies revealed occlusion of his left right femorofemoral bypass. He did undergo outpatient arteriogram which showed no evidence of opiate his inflow iliac vessel. He did have patent superficial femoral arteries bilaterally. The femorofemoral bypass was occluded. There was occlusion of his right common iliac artery at the aortic bifurcation with no evidence reconstitution of the groin. A segment operating this time for redo femorofemoral bypass.  Patient was placed in supine position very but they're both groins were prepped and draped in usual sterile fashion. The left groin was opened through prior incision. This showed the old occluded Gore-Tex graft anastomosed end-to-side to the junction of the common superficial femoral artery. The common superficial femoral and profunda femoris arteries were exposed. There was good pulse in place. The Portex graft was exposed further medially was transected up under release of skin tunnel. Separate incision was made in the right groin over the prior scar. This also was  carried down to isolate the old graft and the common superficial femoral and profundus femoris arteries. These were all circled with Vesseloops. The Gore-Tex graft was mobilized medially and was transected up under the skin subcutaneous tissue. A tunnel was created in a C configuration from the right to the left groin. The patient was given 8000 units of intravenous heparin. After adequate circulation time the left common superficial femoral and profundus femoris arteries were occluded. The old graft was excised from the femoral anastomosis. The new graft of 8mm Hemashield was brought onto the field. This was spatulated and sewn end-to-side to the junction of the common superficial femoral arteries. Usual flushing maneuvers were undertaken anastomosis was completed. The graft was brought to the prior created tunnel to the level of the right groin. The graft was flushed with heparinized saline and reoccluded. The right common superficial femoral and profundus femoris arteries were occluded. The old graft was excised from this. Superficial femoral artery was patent. There was some inflow from the native external iliac artery. The graft was cut to appropriate length and was spatulated and sewn end-to-side to the artery with a running 5-0 Prolene suture. Prior to completion of the anastomosis the usual flushing maneuvers were undertaken. The anastomosis was completed and flow was restored. Patient had excellent Doppler flow in the fistula femoral and profunda femoris arteries bilaterally. The patient was given 50 mg of protamine to reverse the heparin. Wounds irrigated with saline. Hemostasis tablet cautery. The wounds were closed with 2-0 Vicryl in several layers and subcutaneous tissue. Skin was closed with 3 or septic or Vicryl sutures. Sterile dressing was applied the patient was taken to the recovery room stable condition.   David Horne, M.D. 09/10/2014 9:49  AM    

## 2014-09-10 NOTE — Interval H&P Note (Signed)
History and Physical Interval Note:  09/10/2014 6:54 AM  David Horne  has presented today for surgery, with the diagnosis of Left femoral to right femoral bypass occlusion I74.4  The various methods of treatment have been discussed with the patient and family. After consideration of risks, benefits and other options for treatment, the patient has consented to  Procedure(s): REDO BYPASS GRAFT LEFT FEMORAL-RIGHT FEMORAL ARTERY (Bilateral) as a surgical intervention .  The patient's history has been reviewed, patient examined, no change in status, stable for surgery.  I have reviewed the patient's chart and labs.  Questions were answered to the patient's satisfaction.     EARLY, TODD

## 2014-09-10 NOTE — Anesthesia Procedure Notes (Signed)
Procedure Name: Intubation Date/Time: 09/10/2014 7:35 AM Performed by: Quentin OreWALKER, Laketta Soderberg E Pre-anesthesia Checklist: Patient identified, Emergency Drugs available, Suction available, Patient being monitored and Timeout performed Patient Re-evaluated:Patient Re-evaluated prior to inductionOxygen Delivery Method: Circle system utilized Preoxygenation: Pre-oxygenation with 100% oxygen Intubation Type: IV induction Ventilation: Mask ventilation without difficulty and Oral airway inserted - appropriate to patient size Laryngoscope Size: Mac and 4 Grade View: Grade I Tube type: Oral Tube size: 7.5 mm Number of attempts: 1 Airway Equipment and Method: Stylet Placement Confirmation: ETT inserted through vocal cords under direct vision,  positive ETCO2 and breath sounds checked- equal and bilateral Secured at: 23 cm Tube secured with: Tape Dental Injury: Teeth and Oropharynx as per pre-operative assessment

## 2014-09-10 NOTE — Anesthesia Postprocedure Evaluation (Signed)
  Anesthesia Post-op Note  Patient: David Horne  Procedure(s) Performed: Procedure(s): REDO BYPASS GRAFT LEFT FEMORAL-RIGHT FEMORAL ARTERY (Bilateral)  Patient Location: PACU  Anesthesia Type:General  Level of Consciousness: awake  Airway and Oxygen Therapy: Patient Spontanous Breathing  Post-op Pain: mild  Post-op Assessment: Post-op Vital signs reviewed  Post-op Vital Signs: Reviewed  Last Vitals:  Filed Vitals:   09/10/14 1020  BP:   Pulse:   Temp: 36.4 C  Resp:     Complications: No apparent anesthesia complications

## 2014-09-10 NOTE — Progress Notes (Signed)
Wife at bedside-brought pt belongings back and wife is going home.

## 2014-09-10 NOTE — Transfer of Care (Signed)
Immediate Anesthesia Transfer of Care Note  Patient: David Horne  Procedure(s) Performed: Procedure(s): REDO BYPASS GRAFT LEFT FEMORAL-RIGHT FEMORAL ARTERY (Bilateral)  Patient Location: PACU  Anesthesia Type:General  Level of Consciousness: awake, alert  and oriented  Airway & Oxygen Therapy: Patient Spontanous Breathing and Patient connected to nasal cannula oxygen  Post-op Assessment: Report given to PACU RN, Post -op Vital signs reviewed and stable and Patient moving all extremities X 4  Post vital signs: Reviewed and stable  Complications: No apparent anesthesia complications

## 2014-09-10 NOTE — H&P (Signed)
Diagnoses     PVD (peripheral vascular disease) - Primary    ICD-9-CM: 443.9 ICD-10-CM: I73.9       Reason for Visit     New Evaluation    right leg claudication , occluded from fem-fem graft, referral from Dr. Harvel QualeLiniger     PVD    Reason for Visit History        Current Vitals  Most recent update: 08/02/2014 8:34 AM by Reche DixonSonya Dowling Rankin, RN    BP Pulse Resp Ht Wt BMI    170/97 mmHg 92 18 6' 0.5" (1.842 m) 190 lb 8 oz (86.41 kg) 25.47 kg/m2       Progress Notes      Larina Earthlyodd F Kyrielle Urbanski, MD at 08/02/2014 10:04 AM     Status: Signed       Expand All Collapse All       Patient name: David CheshireClyde ClarkMRN: [id number] DOB: 2/7/1947Sex: male   Referred by: Georgiana ShoreLininger  Reason for referral:  Chief Complaint  Patient presents with  . New Evaluation    right leg claudication , occluded from fem-fem graft, referral from Dr. Harvel QualeLiniger   . PVD    HISTORY OF PRESENT ILLNESS: Patient is a very pleasant active 69 year old gentleman with a history 10 years ago of a left to right femoral-femoral bypass for right iliac occlusion. He done extremely well since that time. He reports that one month ago he had the acute onset of recurrent claudication in his right leg. He reports that he was putting up a deer stand and noticed recurrent difficulty. He has had claudication since that time but has had no rest pain and tissue loss. This is quite limiting to him as he is extremely active. He reports discomfort in his thigh and calf which is relieved with rest.  Past Medical History  Diagnosis Date  . Atherosclerosis of native artery of right lower extremity with intermittent claudication   . 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 Esophageal reflux   . Hypertension     Past Surgical History  Procedure Laterality Date  . Femoral artery - femoral artery bypass graft  2005    Left to Right       History   Social History  . Marital Status: Unknown    Spouse Name: N/A    Number of Children: N/A  . Years of Education: N/A   Occupational History  . Not on file.   Social History Main Topics  . Smoking status: Former Smoker    Quit date: 08/31/2000  . Smokeless tobacco: Never Used  . Alcohol Use: No  . Drug Use: No  . Sexual Activity: Not on file   Other Topics Concern  . Not on file   Social History Narrative    Family History  Problem Relation Age of Onset  . Deep vein thrombosis Sister     Blood Clots  . CVA Mother   . Heart disease Mother     Allergies as of 08/02/2014  . (No Known Allergies)    Current Outpatient Prescriptions on File Prior to Visit  Medication Sig Dispense Refill  . AMLODIPINE BESYLATE PO Take 10 mg by mouth.    Marland Kitchen. aspirin 81 MG tablet Take 81 mg by mouth daily.    Marland Kitchen. CLOPIDOGREL BISULFATE PO Take 75 mg by mouth daily.    Marland Kitchen. LOSARTAN POTASSIUM PO Take 50 mg by mouth.    . Multiple Vitamins-Minerals (CENTRUM SILVER PO) Take by  mouth daily.    . Omega-3 Fatty Acids (FISH OIL) 1000 MG CAPS Take 4,000 mg by mouth daily.     Marland Kitchen omeprazole (PRILOSEC) 20 MG capsule Take 20 mg by mouth daily.     No current facility-administered medications on file prior to visit.     REVIEW OF SYSTEMS:  Positives indicated with an "X"  CARDIOVASCULAR:  chest pain  chest pressure  palpitations  orthopnea   dyspnea on exertion [x ] claudication  rest pain  DVT  phlebitis PULMONARY:  productive cough  asthma  wheezing NEUROLOGIC: [x ] weakness  paresthesias  aphasia  amaurosis  dizziness HEMATOLOGIC:  bleeding problems  clotting disorders MUSCULOSKELETAL:  joint pain  joint swelling GASTROINTESTINAL: [x ] blood in stool   hematemesis GENITOURINARY:  dysuria  hematuria PSYCHIATRIC:  history of major depression INTEGUMENTARY:  rashes  ulcers CONSTITUTIONAL:  fever  chills  PHYSICAL EXAMINATION:  General: The patient is a well-nourished male, in no acute distress. Vital signs are BP 170/97 mmHg  Pulse 92  Resp 18  Ht 6' 0.5" (1.842 m)  Wt 190 lb 8 oz (86.41 kg)  BMI 25.47 kg/m2 Pulmonary: There is a good air exchange bilaterally without wheezing or rales. Abdomen: Soft and non-tender with normal pitch bowel sounds. Musculoskeletal: There are no major deformities. There is no significant extremity pain. Neurologic: No focal weakness or paresthesias are detected, Skin: There are no ulcer or rashes noted. Psychiatric: The patient has normal affect. Cardiovascular: There is a regular rate and rhythm without significant murmur appreciated. Pulse status: 2+ radial pulses bilaterally. 2+ left femoral left popliteal and 2+ left dorsalis pedis pulse. Absent right femoral popliteal and absent right pedal pulses  Vascular Lab Studies:  I reviewed his noninvasive studies from 07/18/2014. This does show medical arm index of 0.50 on the right and normal at 1.0 on the left  Impression and Plan:  Right leg claudication with occlusion of prior paced left to right fem-fem bypass. Out of her long discussion with the patient and his wife present. Explained that there is a expected failure rate of fem-fem bypasses. Explain the expected 80% and your patency with these. He clearly has had reocclusion of this approximately one month ago. I have recommended arteriography for further evaluation. I explained that he may have inflow issues regarding his left iliac system but this does not appear to be the case but noninvasive studies and physical exam. Expected he will be an excellent candidate for redo left to right fem-fem bypass. He will undergo outpatient arteriography next week at time the  hospital and we will schedule elective surgery following this.    Wilhelmenia Addis [facility] [place] Office: [phone]

## 2014-09-10 NOTE — Anesthesia Postprocedure Evaluation (Signed)
  Anesthesia Post-op Note  Patient: David Horne  Procedure(s) Performed: Procedure(s): REDO BYPASS GRAFT LEFT FEMORAL-RIGHT FEMORAL ARTERY (Bilateral)  Patient Location: PACU  Anesthesia Type:General  Level of Consciousness: awake  Airway and Oxygen Therapy: Patient Spontanous Breathing  Post-op Pain: mild  Post-op Assessment: Post-op Vital signs reviewed  Post-op Vital Signs: Reviewed  Last Vitals:  Filed Vitals:   09/10/14 0954  BP:   Pulse:   Temp: 36.5 C  Resp:     Complications: No apparent anesthesia complications

## 2014-09-10 NOTE — Anesthesia Preprocedure Evaluation (Addendum)
Anesthesia Evaluation  Patient identified by MRN, date of birth, ID band Patient awake    Reviewed: Allergy & Precautions, NPO status , Patient's Chart, lab work & pertinent test results  Airway Mallampati: I  TM Distance: >3 FB Neck ROM: Full    Dental  (+) Edentulous Upper, Edentulous Lower   Pulmonary neg pulmonary ROS, former smoker,  breath sounds clear to auscultation        Cardiovascular hypertension, Pt. on medications Rhythm:Regular Rate:Normal     Neuro/Psych    GI/Hepatic Neg liver ROS, GERD-  Medicated and Controlled,  Endo/Other  negative endocrine ROS  Renal/GU negative Renal ROS     Musculoskeletal   Abdominal   Peds  Hematology   Anesthesia Other Findings   Reproductive/Obstetrics                            Anesthesia Physical Anesthesia Plan  ASA: III  Anesthesia Plan: General   Post-op Pain Management:    Induction: Intravenous  Airway Management Planned: Oral ETT  Additional Equipment:   Intra-op Plan:   Post-operative Plan: Extubation in OR  Informed Consent: I have reviewed the patients History and Physical, chart, labs and discussed the procedure including the risks, benefits and alternatives for the proposed anesthesia with the patient or authorized representative who has indicated his/her understanding and acceptance.   Dental advisory given  Plan Discussed with: CRNA, Anesthesiologist and Surgeon  Anesthesia Plan Comments:        Anesthesia Quick Evaluation

## 2014-09-11 ENCOUNTER — Encounter (HOSPITAL_COMMUNITY): Payer: Self-pay | Admitting: Vascular Surgery

## 2014-09-11 LAB — BASIC METABOLIC PANEL
Anion gap: 3 — ABNORMAL LOW (ref 5–15)
BUN: 13 mg/dL (ref 6–23)
CO2: 28 mmol/L (ref 19–32)
Calcium: 8.3 mg/dL — ABNORMAL LOW (ref 8.4–10.5)
Chloride: 105 mEq/L (ref 96–112)
Creatinine, Ser: 1.61 mg/dL — ABNORMAL HIGH (ref 0.50–1.35)
GFR calc Af Amer: 49 mL/min — ABNORMAL LOW (ref >90)
GFR, EST NON AFRICAN AMERICAN: 42 mL/min — AB (ref >90)
GLUCOSE: 107 mg/dL — AB (ref 70–99)
Potassium: 4 mmol/L (ref 3.5–5.1)
Sodium: 136 mmol/L (ref 135–145)

## 2014-09-11 LAB — CBC
HEMATOCRIT: 38.8 % — AB (ref 39.0–52.0)
Hemoglobin: 13 g/dL (ref 13.0–17.0)
MCH: 30.2 pg (ref 26.0–34.0)
MCHC: 33.5 g/dL (ref 30.0–36.0)
MCV: 90.2 fL (ref 78.0–100.0)
Platelets: 222 10*3/uL (ref 150–400)
RBC: 4.3 MIL/uL (ref 4.22–5.81)
RDW: 13.3 % (ref 11.5–15.5)
WBC: 8.7 10*3/uL (ref 4.0–10.5)

## 2014-09-11 MED ORDER — OXYCODONE-ACETAMINOPHEN 5-325 MG PO TABS
1.0000 | ORAL_TABLET | ORAL | Status: DC | PRN
Start: 2014-09-11 — End: 2017-01-11

## 2014-09-11 NOTE — Progress Notes (Signed)
Subjective: Interval History: none.. Comfortable. Just had Foley removed.  Objective: Vital signs in last 24 hours: Temp:  [97.5 F (36.4 C)-99.6 F (37.6 C)] 99.2 F (37.3 C) (01/12 0500) Pulse Rate:  [61-90] 88 (01/12 0500) Resp:  [2-23] 17 (01/12 0500) BP: (117-149)/(63-78) 128/73 mmHg (01/12 0500) SpO2:  [85 %-100 %] 92 % (01/12 0500)  Intake/Output from previous day: 01/11 0701 - 01/12 0700 In: 2000 [P.O.:500; I.V.:1500] Out: 2980 [Urine:2780; Blood:200] Intake/Output this shift: Total I/O In: -  Out: 1600 [Urine:1600]  Groin dressings removed. Healing without hematoma. Palpable femorofemoral graft pulse palpable left popliteal pulse and biphasic posterior tibial signal on the right  Lab Results:  Recent Labs  09/10/14 1630 09/11/14 0517  WBC 11.1* 8.7  HGB 12.6* 13.0  HCT 37.1* 38.8*  PLT 229 222   BMET  Recent Labs  09/10/14 1630  CREATININE 1.36*    Studies/Results: No results found. Anti-infectives: Anti-infectives    Start     Dose/Rate Route Frequency Ordered Stop   09/10/14 1700  cefUROXime (ZINACEF) 1.5 g in dextrose 5 % 50 mL IVPB     1.5 g100 mL/hr over 30 Minutes Intravenous Every 12 hours 09/10/14 1557 09/11/14 1659   09/09/14 1347  cefUROXime (ZINACEF) 1.5 g in dextrose 5 % 50 mL IVPB     1.5 g100 mL/hr over 30 Minutes Intravenous 30 min pre-op 09/09/14 1347 09/10/14 0747      Assessment/Plan: s/p Procedure(s): REDO BYPASS GRAFT LEFT FEMORAL-RIGHT FEMORAL ARTERY (Bilateral) Stable postop day 1. Probable discharge later on this morning after ambulating   LOS: 1 day   Minnah Llamas 09/11/2014, 7:00 AM

## 2014-09-11 NOTE — Evaluation (Signed)
Occupational Therapy Evaluation Patient Details Name: David KaufmanClyde Horne MRN: [id number] DOB: 03/09/1946 Today's Date: 09/11/2014    History of Present Illness  69 year old gentleman with a history 10 years ago of a left to right femoral-femoral bypass for right iliac occlusion now s/p redo fem-fem BPG   Clinical Impression   Pt was independent prior to admission in ADL and IADL.  Pt presents with nausea, limiting mobility.  Able to demonstrate/simulated ADL and ADL transfers at a modified independent level.  No further OT needs.   Follow Up Recommendations  No OT follow up    Equipment Recommendations  None recommended by OT    Recommendations for Other Services       Precautions / Restrictions Precautions Precautions: None Restrictions Weight Bearing Restrictions: No      Mobility Bed Mobility           General bed mobility comments: pt up in chair  Transfers Overall transfer level: Modified independent                   Balance                                            ADL Overall ADL's : Modified independent                                       General ADL Comments: Pt demonstrated ability to donn and doff socks crossing his foot over opposite knee.  He stood from the chair and transferred to bed modified independently simulating toilet transfers and stepped over garbage can simulating tub transfer.  Pt declined ambulation to bathroom or sink due to nausea and dizziness.     Vision                     Perception     Praxis      Pertinent Vitals/Pain Pain Assessment: No/denies pain Pain Score: 4  Pain Location: left groin Pain Descriptors / Indicators: Burning Pain Intervention(s): Limited activity within patient's tolerance     Hand Dominance Right   Extremity/Trunk Assessment Upper Extremity Assessment Upper Extremity Assessment: Overall WFL for tasks assessed   Lower Extremity Assessment Lower  Extremity Assessment: Overall WFL for tasks assessed   Cervical / Trunk Assessment Cervical / Trunk Assessment: Normal   Communication Communication Communication: No difficulties   Cognition Arousal/Alertness: Awake/alert Behavior During Therapy: WFL for tasks assessed/performed Overall Cognitive Status: Within Functional Limits for tasks assessed                     General Comments       Exercises       Shoulder Instructions      Home Living Family/patient expects to be discharged to:: Private residence Living Arrangements: Spouse/significant other Available Help at Discharge: Family;Available 24 hours/day Type of Home: House Home Access: Stairs to enter Enter[employer] CorporationEntrance Stairs-Number of Steps: 4   Home Layout: One level     Bathroom Shower/Tub: Chief Strategy OfficerTub/shower unit   Bathroom Toilet: Standard     Home Equipment: Cane - single point          Prior Functioning/Environment Level of Independence: Independent             OT Diagnosis:  OT Problem List:     OT Treatment/Interventions:      OT Goals(Current goals can be found in the care plan section) Acute Rehab OT Goals Patient Stated Goal: return home  OT Frequency:     Barriers to D/C:            Co-evaluation              End of Session    Activity Tolerance: Treatment limited secondary to medical complications (Comment) (nausea) Patient left: in chair;with call bell/phone within reach;with family/visitor present   Time: 1610-9604 OT Time Calculation (min): 17 min Charges:  OT General Charges $OT Visit: 1 Procedure OT Evaluation $Initial OT Evaluation Tier I: 1 Procedure OT Treatments $Self Care/Home Management : 8-22 mins G-Codes:    Evern Bio 09/11/2014, 2:26 PM [phone]

## 2014-09-11 NOTE — Progress Notes (Signed)
Utilization review completed.  

## 2014-09-11 NOTE — Evaluation (Signed)
Physical Therapy Evaluation Patient Details Name: David KaufmanClyde Benscoter MRN: [id number] DOB: 06/06/1946 Today's Date: 09/11/2014   History of Present Illness   69 year old gentleman with a history 10 years ago of a left to right femoral-femoral bypass for right iliac occlusion now s/p redo fem-fem BPG  Clinical Impression  Pt very pleasant and eager to advance mobility to return home. Pt sat EOB with nausea, provided cool cloth and eased nausea. Pt stood with return of nausea, RN gave anti-nausea meds EOB, pt returned to supine stating symptoms eased. Pt again stood and walked to chair but nausea and vomiting sitting in chair ended session prematurely. Pt unable to tolerate further activity at this time but balance and gait are near baseline. Will follow for at least another session as pt able to tolerate to maximize gait and balance to make sure pt is safe for D/C. Pt encouraged to continue HEP and gait with nursing.    Follow Up Recommendations No PT follow up    Equipment Recommendations  None recommended by PT    Recommendations for Other Services       Precautions / Restrictions Precautions Precautions: None Restrictions Weight Bearing Restrictions: No      Mobility  Bed Mobility Overal bed mobility: Modified Independent                Transfers Overall transfer level: Modified independent               General transfer comment: x 3 trials from bed, limited by nausea each time  Ambulation/Gait Ambulation/Gait assistance: Modified independent (Device/Increase time) Ambulation Distance (Feet): 8 Feet Assistive device: Straight cane Gait Pattern/deviations: Step-through pattern;Decreased stride length   Gait velocity interpretation: Below normal speed for age/gender General Gait Details: limited distance by nausea  Stairs            Wheelchair Mobility    Modified Rankin (Stroke Patients Only)       Balance                                              Pertinent Vitals/Pain Pain Assessment: 0-10 Pain Score: 4  Pain Location: left groin Pain Descriptors / Indicators: Burning Pain Intervention(s): Limited activity within patient's tolerance    Home Living Family/patient expects to be discharged to:: Private residence Living Arrangements: Spouse/significant other Available Help at Discharge: Family;Available 24 hours/day Type of Home: House Home Access: Stairs to enter   [employer] CorporationEntrance Stairs-Number of Steps: 4 Home Layout: One level Home Equipment: Cane - single point      Prior Function Level of Independence: Independent               Hand Dominance        Extremity/Trunk Assessment   Upper Extremity Assessment: Overall WFL for tasks assessed           Lower Extremity Assessment: Overall WFL for tasks assessed      Cervical / Trunk Assessment: Normal  Communication   Communication: No difficulties  Cognition Arousal/Alertness: Awake/alert Behavior During Therapy: WFL for tasks assessed/performed Overall Cognitive Status: Within Functional Limits for tasks assessed                      General Comments      Exercises General Exercises - Lower Extremity Hip Flexion/Marching: AROM;Seated;Both;5 reps      Assessment/Plan  PT Assessment Patient needs continued PT services  PT Diagnosis Difficulty walking;Acute pain   PT Problem List Decreased strength;Pain;Decreased mobility  PT Treatment Interventions Gait training;Stair training;Functional mobility training;Therapeutic activities;Therapeutic exercise;Patient/family education   PT Goals (Current goals can be found in the Care Plan section) Acute Rehab PT Goals Patient Stated Goal: return home PT Goal Formulation: With patient Time For Goal Achievement: 09/18/14 Potential to Achieve Goals: Good    Frequency Min 3X/week   Barriers to discharge        Co-evaluation               End of Session   Activity  Tolerance: Other (comment) (limited by nausea and vomiting) Patient left: in chair;with call bell/phone within reach;with nursing/sitter in room Nurse Communication: Mobility status         Time: 8469-6295 PT Time Calculation (min) (ACUTE ONLY): 22 min   Charges:     PT Treatments $Therapeutic Activity: 8-22 mins   PT G Codes:        Delorse Lek 09/11/2014, 11:11 AM Delaney Meigs, PT [phone]

## 2014-09-11 NOTE — Progress Notes (Signed)
PT Cancellation Note  Patient Details Name: David KaufmanClyde Heitman MRN: [id number] DOB: 12/04/1945   Cancelled Treatment:    Reason Eval/Treat Not Completed: Other (comment) (attempted to see pt in PM to increase mobility but pt continues to report dizziness and nausea. BP check : sitting 111/67, standing 108/84). Will attempt next date   Delorse Lekabor, Miciah Covelli Beth 09/11/2014, 2:04 PM Delaney MeigsMaija Tabor Janyah Singleterry, PT [phone]

## 2014-09-11 NOTE — Progress Notes (Signed)
  Progress Note    09/11/2014 3:14 PM 1 Day Post-Op  Subjective:  States he is nauseated.  He doesn't want anything for it.  He wants to "let it work its way out".  Has ambulated in his room without difficulty.  Denies pain.  afebrile  Filed Vitals:   09/11/14 1300  BP: 118/68  Pulse: 79  Temp: 98 F (36.7 C)  Resp: 17    CBC    Component Value Date/Time   WBC 8.7 09/11/2014 0517   RBC 4.30 09/11/2014 0517   HGB 13.0 09/11/2014 0517   HCT 38.8* 09/11/2014 0517   PLT 222 09/11/2014 0517   MCV 90.2 09/11/2014 0517   MCH 30.2 09/11/2014 0517   MCHC 33.5 09/11/2014 0517   RDW 13.3 09/11/2014 0517    BMET    Component Value Date/Time   NA 136 09/11/2014 0517   K 4.0 09/11/2014 0517   CL 105 09/11/2014 0517   CO2 28 09/11/2014 0517   GLUCOSE 107* 09/11/2014 0517   BUN 13 09/11/2014 0517   CREATININE 1.61* 09/11/2014 0517   CALCIUM 8.3* 09/11/2014 0517   GFRNONAA 42* 09/11/2014 0517   GFRAA 49* 09/11/2014 0517    INR    Component Value Date/Time   INR 1.03 09/04/2014 0857     Intake/Output Summary (Last 24 hours) at 09/11/14 1514 Last data filed at 09/11/14 1300  Gross per 24 hour  Intake    740 ml  Output   1900 ml  Net  -1160 ml     Assessment:  69 y.o. male is s/p:  Redo left to right femorofemoral bypass with 8 mm Hemashield graft  1 Day Post-Op  Plan: -will postpone discharge until tomorrow if nausea is better -does not want any medication (anti-emetic or pain medication). -continue to observe   Doreatha MassedSamantha Epiphany Seltzer, PA-C [facility] [phone] 09/11/2014 3:14 PM

## 2014-09-12 ENCOUNTER — Telehealth: Payer: Self-pay | Admitting: Vascular Surgery

## 2014-09-12 NOTE — Progress Notes (Signed)
  [facility] Progress Note  09/12/2014 7:37 AM 2 Days Post-Op  Subjective:  No nausea this morning but is concerned about having further nausea. "Wants to be safe" before heading home. Complains of "burning" pain of anterior thighs bilaterally with certain position changes. Denies any pain with ambulation.     Filed Vitals:   09/12/14 0613  BP: 134/71  Pulse: 104  Temp: 100 F (37.8 C)  Resp: 18    Physical Exam: Incisions:  Bilateral groin incisions healing well. No hematoma.  Steri strips in place. Abdomen: palpable fem-fem graft pulse.  Extremities:  Feet are warm bilaterally. Biphasic AT/PT doppler signals bilaterally.   CBC    Component Value Date/Time   WBC 8.7 09/11/2014 0517   RBC 4.30 09/11/2014 0517   HGB 13.0 09/11/2014 0517   HCT 38.8* 09/11/2014 0517   PLT 222 09/11/2014 0517   MCV 90.2 09/11/2014 0517   MCH 30.2 09/11/2014 0517   MCHC 33.5 09/11/2014 0517   RDW 13.3 09/11/2014 0517    BMET    Component Value Date/Time   NA 136 09/11/2014 0517   K 4.0 09/11/2014 0517   CL 105 09/11/2014 0517   CO2 28 09/11/2014 0517   GLUCOSE 107* 09/11/2014 0517   BUN 13 09/11/2014 0517   CREATININE 1.61* 09/11/2014 0517   CALCIUM 8.3* 09/11/2014 0517   GFRNONAA 42* 09/11/2014 0517   GFRAA 49* 09/11/2014 0517    INR    Component Value Date/Time   INR 1.03 09/04/2014 0857     Intake/Output Summary (Last 24 hours) at 09/12/14 0737 Last data filed at 09/12/14 0016  Gross per 24 hour  Intake    240 ml  Output    375 ml  Net   -135 ml     Assessment:  69 y.o. male is s/p: redo bypass graft left femoral-right femoral artery  2 Days Post-Op  Plan: -Palpable graft pulse. Biphasic doppler signals bilaterally. -Nausea resolved but patient wants to see if he has any further nausea this am.  -Intermittent bilateral anterior thigh "burning" sensation likely from redo access in groins and nerve retraction. This will hopefully improve in the  next few weeks.  -DVT prophylaxis:  Lovenox -Dispo: He feels ready to go home today. Wants to ambulate more. Will discharge home today.   Maris BergerKimberly Trinh, PA-C [facility] Office: [phone] Pager: [phone] 09/12/2014 7:37 AM

## 2014-09-12 NOTE — Progress Notes (Signed)
PT Cancellation Note  Patient Details Name: David KaufmanClyde Horne MRN: [id number] DOB: 01/14/1946   Cancelled Treatment:    Reason Eval/Treat Not Completed: Patient declined, no reason specified Patient politely declines PT services at this time. States he has been ambulating throughout the halls this AM and does not have any concerns regarding his ability to mobilize safely. Will continue to follow until d/c.  [address]Logan Secor EwingBarbour, South CarolinaPT [phone]  [name], [name] 09/12/2014, 9:03 AM

## 2014-09-12 NOTE — Care Management Note (Signed)
    Page 1 of 1   09/12/2014     12:25:06 PM CARE MANAGEMENT NOTE 09/12/2014  Patient:  David Horne,David Horne   Account Number:  [id number]  Date Initiated:  09/12/2014  Documentation initiated by:  Donn PieriniWEBSTER,Arnold Kester  Subjective/Objective Assessment:   Pt admitted s/p femfem bypass graft     Action/Plan:   PTA pt lived at home   Anticipated DC Date:  09/12/2014   Anticipated DC Plan:  HOME/SELF CARE         Choice offered to / List presented to:             Status of service:  Completed, signed off Medicare Important Message given?  NA - LOS <3 / Initial given by admissions (If response is "NO", the following Medicare IM given date fields will be blank) Date Medicare IM given:   Medicare IM given by:   Date Additional Medicare IM given:   Additional Medicare IM given by:    Discharge Disposition:  HOME/SELF CARE  Per UR Regulation:  Reviewed for med. necessity/level of care/duration of stay  If discussed at Long Length of Stay Meetings, dates discussed:    Comments:

## 2014-09-12 NOTE — Telephone Encounter (Addendum)
-----   Message from Sharee PimpleMarilyn K McChesney, RN sent at 09/11/2014  9:59 AM EST ----- Regarding: Schedule   ----- Message -----    From: Lars MageEmma M Collins, PA-C    Sent: 09/11/2014   8:13 AM      To: [facility] Charge Pool  F/U in office with Dr. Arbie CookeyEarly in 2 weeks.  S/P Fem-fem re-do by pass.   09/12/14: was told that he wasn't home, mailed letter, dpm

## 2014-09-13 NOTE — Discharge Summary (Signed)
[facility] Discharge Summary  David Horne Jun 04, 1946 69 y.o. 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 He has done extremely well since that time. He reports that one month ago he had the acute onset of recurrent claudication in his right leg. He reports that he was putting up a deer stand and noticed recurrent difficulty. He has had claudication since that time but has had no rest pain and tissue loss. This is quite limiting to him as he is extremely active. He reports discomfort in his thigh and calf which is relieved with rest.  Hospital Course:  The patient was admitted to the hospital and taken to the operating room on 09/10/2014 and underwent: Redo left to right femorofemoral bypass with 8 mm Hemashield graft    The patient tolerated the procedure well and was transported to the PACU in stable condition.   He was doing well on POD 1. His incisions were clean and intact. He had a palpable femorofemoral graft pulse, palpable left popliteal pulse and biphasic right posterior tibial doppler signal. He remained in the hospital overnight due to complaints of nausea. He was ambulating well without difficulty.  On POD 2, his nausea had resolved and he was ready to go home. There were no acute changes from the previous day. He was discharged home on POD 2 in good condition.   CBC    Component Value Date/Time   WBC 8.7 09/11/2014 0517   RBC 4.30 09/11/2014 0517   HGB 13.0 09/11/2014 0517   HCT 38.8* 09/11/2014 0517   PLT 222 09/11/2014 0517   MCV 90.2 09/11/2014 0517   MCH 30.2 09/11/2014 0517   MCHC 33.5 09/11/2014 0517   RDW 13.3 09/11/2014 0517    BMET    Component Value Date/Time   NA 136  09/11/2014 0517   K 4.0 09/11/2014 0517   CL 105 09/11/2014 0517   CO2 28 09/11/2014 0517   GLUCOSE 107* 09/11/2014 0517   BUN 13 09/11/2014 0517   CREATININE 1.61* 09/11/2014 0517   CALCIUM 8.3* 09/11/2014 0517   GFRNONAA 42* 09/11/2014 0517   GFRAA 49* 09/11/2014 0517     Discharge Instructions:   The patient is discharged to home with extensive instructions on wound care and progressive ambulation.  They are instructed not to drive or perform any heavy lifting until returning to see the physician in his office.  Discharge Instructions    Call MD for:  redness, tenderness, or signs of infection (pain, swelling, bleeding, redness, odor or green/yellow discharge around incision site)    Complete by:  As directed      Call MD for:  severe or increased pain, loss or decreased feeling  in affected limb(s)    Complete by:  As directed      Call MD for:  temperature >100.5    Complete by:  As directed      Discharge instructions    Complete by:  As directed   You may shower in 24 hours from discharge     Driving Restrictions    Complete by:  As directed   No driving for 2 weeks     Lifting restrictions    Complete by:  As  directed   No lifting for 6 weeks     Resume previous diet    Complete by:  As directed            Discharge Diagnosis:  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 PVD (peripheral vascular disease) 08/06/2014   Past Medical History  Diagnosis Date  . Atherosclerosis of native artery of right lower extremity with intermittent claudication   . 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 Esophageal reflux   . Hypertension        Medication List    TAKE these medications        amLODipine 10 MG tablet  Commonly known as:  NORVASC  Take 10 mg by mouth daily.     ascorbic acid 1000 MG tablet  Commonly  known as:  VITAMIN C  Take 1,000 mg by mouth daily.     aspirin 81 MG tablet  Take 81 mg by mouth daily.     CENTRUM SILVER PO  Take by mouth daily.     clopidogrel 75 MG tablet  Commonly known as:  PLAVIX  Take 75 mg by mouth daily.     Fish Oil 1000 MG Caps  Take 4,000 mg by mouth daily.     losartan 50 MG tablet  Commonly known as:  COZAAR  Take 50 mg by mouth daily.     omeprazole 20 MG capsule  Commonly known as:  PRILOSEC  Take 20 mg by mouth daily.     oxyCODONE-acetaminophen 5-325 MG per tablet  Commonly known as:  PERCOCET/ROXICET  Take 1-2 tablets by mouth every 4 (four) hours as needed for moderate pain.        Percocet #30 No Refill  Disposition: 30  Patient's condition: is Good  Follow up: 1. Dr. Arbie CookeyEarly in 2 weeks   David BergerKimberly [name], PA-C [facility] [phone] 09/13/2014  5:12 PM  - For VQI Registry use --- Instructions: Press F2 to tab through selections.  Delete question if not applicable.   Post-op:  Wound infection: No  Graft infection: No  Transfusion: No  New Arrhythmia: No Ipsilateral amputation: No, [ ]  Minor, [ ]  BKA, [ ]  AKA Discharge patency: [ ]  Primary, [ ]  Primary assisted, [x]  Secondary, [ ]  Occluded Patency judged by: [x ] Dopper only, [ ]  Palpable graft pulse, [ ]  Palpable distal pulse, [ ]  ABI inc. > 0.15, [ ]  Duplex D/C Ambulatory Status: Ambulatory  Complications: MI: No, [ ]  Troponin only, [ ]  EKG or Clinical CHF: No Resp failure:No, [ ]  Pneumonia, [ ]  Ventilator Chg in renal function: No, [ ]  Inc. Cr > 0.5, [ ]  Temp. Dialysis, [ ]  Permanent dialysis Stroke: No, [ ]  Minor, [ ]  Major Return to OR: No  Reason for return to OR: [ ]  Bleeding, [ ]  Infection, [ ]  Thrombosis, [ ]  Revision  Discharge medications: Statin use:  yes ASA use:  yes Plavix use:  yes Beta blocker use: no Coumadin use: no

## 2014-09-24 ENCOUNTER — Encounter: Payer: Self-pay | Admitting: Vascular Surgery

## 2014-09-25 ENCOUNTER — Ambulatory Visit (INDEPENDENT_AMBULATORY_CARE_PROVIDER_SITE_OTHER): Payer: Self-pay | Admitting: Vascular Surgery

## 2014-09-25 ENCOUNTER — Encounter: Payer: Self-pay | Admitting: Vascular Surgery

## 2014-09-25 VITALS — BP 149/96 | HR 94 | Ht 73.0 in | Wt 183.1 lb

## 2014-09-25 DIAGNOSIS — I739 Peripheral vascular disease, unspecified: Secondary | ICD-10-CM

## 2014-09-25 DIAGNOSIS — Z48812 Encounter for surgical aftercare following surgery on the circulatory system: Secondary | ICD-10-CM

## 2014-09-25 NOTE — Addendum Note (Signed)
Addended by: Sharee PimpleMCCHESNEY, Darlin Stenseth K on: 09/25/2014 11:51 AM   Modules accepted: Orders

## 2014-09-25 NOTE — Progress Notes (Signed)
Today for follow-up of his redo left to right femorofemoral bypass on 09/10/2014. He had had a prior bypass that [facility] and years ago. He had had recurrent claudication and occlusion of his bypass.  He had an uneventful postoperative course and was discharged home on postoperative day #2. He has done well since discharge and has had no claudication symptoms. He is slowly increasing his activity level  On physical exam of both groin incisions are well-healed. He has an easily palpable femorofemoral bypass graft pulse. He does have diminished pedal pulses bilaterally  Impression and plan: Stable initial follow-up. We'll continue to slowly increase activity. We'll see him again in 3 months for office visit and ankle arm indices

## 2014-12-25 ENCOUNTER — Ambulatory Visit: Payer: Medicare Other | Admitting: Vascular Surgery

## 2014-12-25 ENCOUNTER — Encounter (HOSPITAL_COMMUNITY): Payer: Medicare Other

## 2015-01-08 ENCOUNTER — Ambulatory Visit: Payer: Medicare Other | Admitting: Vascular Surgery

## 2015-01-08 ENCOUNTER — Ambulatory Visit (HOSPITAL_COMMUNITY)
Admission: RE | Admit: 2015-01-08 | Discharge: 2015-01-08 | Disposition: A | Discharge status: Home / Self Care | Payer: Medicare Other | Source: Ambulatory Visit | Attending: Vascular Surgery | Admitting: Vascular Surgery

## 2015-01-08 DIAGNOSIS — I739 Peripheral vascular disease, unspecified: Secondary | ICD-10-CM | POA: Diagnosis not present

## 2015-01-08 DIAGNOSIS — Z48812 Encounter for surgical aftercare following surgery on the circulatory system: Secondary | ICD-10-CM | POA: Diagnosis not present

## 2015-01-18 ENCOUNTER — Encounter: Payer: Self-pay | Admitting: Vascular Surgery

## 2015-01-22 ENCOUNTER — Ambulatory Visit (INDEPENDENT_AMBULATORY_CARE_PROVIDER_SITE_OTHER): Payer: Medicare Other | Admitting: Vascular Surgery

## 2015-01-22 ENCOUNTER — Encounter: Payer: Self-pay | Admitting: Vascular Surgery

## 2015-01-22 VITALS — BP 150/83 | HR 73 | Resp 16 | Ht 73.0 in | Wt 189.0 lb

## 2015-01-22 DIAGNOSIS — Z48812 Encounter for surgical aftercare following surgery on the circulatory system: Secondary | ICD-10-CM | POA: Diagnosis not present

## 2015-01-22 DIAGNOSIS — I739 Peripheral vascular disease, unspecified: Secondary | ICD-10-CM | POA: Diagnosis not present

## 2015-01-22 NOTE — Progress Notes (Signed)
Patient resents today for follow-up of his redo left to right femorofemoral bypass on 09/10/2014. He had had initial bypass done approximately [place] ago [facility] [place]. He reports continued symptoms that are consistent with mild bilateral calf claudication. He reports that this is equal right and left leg. This is not particularly limiting to him. He also reports a persistent tingling and numbness over his left anterior thigh. This is not associated with exercise. He's had no new medical difficulties.  Past Medical History  Diagnosis Date  . Atherosclerosis of native artery of right lower extremity with intermittent claudication   . 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 Esophageal reflux   . Hypertension     History  Substance Use Topics  . Smoking status: Former Smoker    Quit date: 08/31/2000  . Smokeless tobacco: Never Used  . Alcohol Use: No    Family History  Problem Relation Age of Onset  . Deep vein thrombosis Sister     Blood Clots  . CVA Mother   . Heart disease Mother     No Known Allergies   Current outpatient prescriptions:  .  amLODipine (NORVASC) 10 MG tablet, Take 10 mg by mouth daily., Disp: , Rfl:  .  ascorbic acid (VITAMIN C) 1000 MG tablet, Take 1,000 mg by mouth daily., Disp: , Rfl:  .  aspirin 81 MG tablet, Take 81 mg by mouth daily., Disp: , Rfl:  .  clopidogrel (PLAVIX) 75 MG tablet, Take 75 mg by mouth daily., Disp: , Rfl:  .  losartan (COZAAR) 50 MG tablet, Take 50 mg by mouth daily., Disp: , Rfl:  .  Multiple Vitamins-Minerals (CENTRUM SILVER PO), Take by mouth daily., Disp: , Rfl:  .  Omega-3 Fatty Acids (FISH OIL) 1000 MG CAPS, Take 4,000 mg by mouth daily. , Disp: , Rfl:  .  omeprazole (PRILOSEC) 20 MG capsule, Take 20 mg by mouth daily., Disp: , Rfl:  .  oxyCODONE-acetaminophen (PERCOCET/ROXICET) 5-325 MG per tablet, Take 1-2 tablets by mouth every 4 (four) hours as needed for moderate pain. (Patient not  taking: Reported on 01/22/2015), Disp: 30 tablet, Rfl: 0  Filed Vitals:   01/22/15 1521 01/22/15 1525  BP: 152/76 150/83  Pulse: 81 73  Resp: 16   Height: 6\' 1"  (1.854 m)   Weight: 189 lb (85.73 kg)     Body mass index is 24.94 kg/(m^2).       Physical exam well-developed well-nourished gentleman acute distress Her groin incisions are well-healed with 2+ femoral pulses bilaterally and easily palpable femorofemoral bypass pulse. He does have a palpable posterior tibial pulses bilaterally.  Noninvasive studies from 01/08/2015 reveal ankle arm index of 0.9 on the right and 1.0 and the left with biphasic waveforms bilaterally  Impression and plan stable follow-up redo left to right femorofemoral bypass. Explained that his Symptoms may be related to mild arterial insufficiency. Explained this is not dangerous. Explain the only option would be for repeat arteriogram feel that he has a low likelihood of finding of correctable narrowing. He is comfortable with this level of discomfort. Also discussed that the anterior left thigh was related to the redo incision and suspect that this will continue to resolve spontaneously over time as well. We will see him in 6 months with repeat ankle arm indices anddiscussion of this.

## 2015-01-22 NOTE — Progress Notes (Signed)
Filed Vitals:   01/22/15 1521 01/22/15 1525  BP: 152/76 150/83  Pulse: 81 73  Resp: 16   Height: 6\' 1"  (1.854 m)   Weight: 189 lb (85.73 kg)    Body mass index is 24.94 kg/(m^2).

## 2015-01-23 NOTE — Addendum Note (Signed)
Addended by: Adria DillELDRIDGE-LEWIS, Jerricka Carvey L on: 01/23/2015 02:24 PM   Modules accepted: Orders

## 2015-07-23 ENCOUNTER — Encounter: Payer: Self-pay | Admitting: Vascular Surgery

## 2015-07-30 ENCOUNTER — Ambulatory Visit (HOSPITAL_COMMUNITY)
Admission: RE | Admit: 2015-07-30 | Discharge: 2015-07-30 | Disposition: A | Discharge status: Home / Self Care | Payer: Medicare Other | Source: Ambulatory Visit | Attending: Vascular Surgery | Admitting: Vascular Surgery

## 2015-07-30 ENCOUNTER — Ambulatory Visit (INDEPENDENT_AMBULATORY_CARE_PROVIDER_SITE_OTHER): Payer: Medicare Other | Admitting: Vascular Surgery

## 2015-07-30 ENCOUNTER — Encounter: Payer: Self-pay | Admitting: Vascular Surgery

## 2015-07-30 VITALS — BP 137/87 | HR 85 | Ht 73.0 in | Wt 189.0 lb

## 2015-07-30 DIAGNOSIS — Z48812 Encounter for surgical aftercare following surgery on the circulatory system: Secondary | ICD-10-CM

## 2015-07-30 DIAGNOSIS — I739 Peripheral vascular disease, unspecified: Secondary | ICD-10-CM

## 2015-07-30 NOTE — Addendum Note (Signed)
Addended by: Adria DillELDRIDGE-LEWIS, Annamary Buschman L on: 07/30/2015 11:44 AM   Modules accepted: Orders

## 2015-07-30 NOTE — Progress Notes (Signed)
[facility]  Patient name: David KaufmanClyde Horne MRN: [id number] DOB: 04/08/1946 Sex: male  REASON FOR VISIT: Follow-up of left to right femorofemoral bypass  HPI: David KaufmanClyde Horne is a 69 y.o. male status post redo left to right femorofemoral bypass in January 2016. [facility] [place] [facility] [place] years [place]. He had had recurrent claudication symptoms. He has done quite well following surgery. He specifically denies any claudication type symptoms. His wife is with him and is concerned that he is actually too active. He has no hip buttocks or calf claudication type symptoms.  Past Medical History  Diagnosis Date  . Atherosclerosis of native artery of right lower extremity with intermittent claudication (HCC)   . 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 Esophageal reflux   . Hypertension     Family History  Problem Relation Age of Onset  . Deep vein thrombosis Sister     Blood Clots  . CVA Mother   . Heart disease Mother     SOCIAL HISTORY: Social History  Substance Use Topics  . Smoking status: Former Smoker    Quit date: 08/31/2000  . Smokeless tobacco: Never Used  . Alcohol Use: No    No Known Allergies  Current Outpatient Prescriptions  Medication Sig Dispense Refill  . amLODipine (NORVASC) 10 MG tablet Take 10 mg by mouth daily.    Marland Kitchen. ascorbic acid (VITAMIN C) 1000 MG tablet Take 1,000 mg by mouth daily.    Marland Kitchen. aspirin 81 MG tablet Take 81 mg by mouth daily.    . clopidogrel (PLAVIX) 75 MG tablet Take 75 mg by mouth daily.    Marland Kitchen. losartan (COZAAR) 50 MG tablet Take 50 mg by mouth daily.    . Multiple Vitamins-Minerals (CENTRUM SILVER PO) Take by mouth daily.    . Omega-3 Fatty Acids (FISH OIL) 1000 MG CAPS Take 4,000 mg by mouth daily.     Marland Kitchen. omeprazole (PRILOSEC) 20 MG capsule Take 20 mg by mouth daily.    Marland Kitchen. oxyCODONE-acetaminophen (PERCOCET/ROXICET) 5-325 MG per tablet Take 1-2 tablets by  mouth every 4 (four) hours as needed for moderate pain. (Patient not taking: Reported on 01/22/2015) 30 tablet 0   No current facility-administered medications for this visit.    REVIEW OF SYSTEMS:  [X]  denotes positive finding, [ ]  denotes negative finding Cardiac  Comments:  Chest pain or chest pressure:    Shortness of breath upon exertion:    Short of breath when lying flat:    Irregular heart rhythm:        Vascular    Pain in calf, thigh, or hip brought on by ambulation:    Pain in feet at night that wakes you up from your sleep:     Blood clot in your veins:    Leg swelling:         Pulmonary    Oxygen at home:    Productive cough:     Wheezing:         Neurologic    Sudden weakness in arms or legs:     Sudden numbness in arms or legs:     Sudden onset of difficulty speaking or slurred speech:    Temporary loss of vision in one eye:     Problems with dizziness:         Gastrointestinal    Blood in stool:     Vomited blood:  Genitourinary    Burning when urinating:     Blood in urine:        Psychiatric    Major depression:         Hematologic    Bleeding problems:    Problems with blood clotting too easily:        Skin    Rashes or ulcers:        Constitutional    Fever or chills:      PHYSICAL EXAM: Filed Vitals:   07/30/15 0937  BP: 137/87  Pulse: 85  Height:  (1.854 m)  Weight: 189 lb (85.73 kg)  SpO2: 96%    GENERAL: The patient is a well-nourished male, in no acute distress. The vital signs are documented above. VASCULAR: Groin incisions are well-healed. He has an easily palpable femorofemoral graft pulse. He has 2+ posterior tibial pulses bilaterally PULMONARY: There is good air exchange  ABDOMEN: Soft and non-tender  MUSCULOSKELETAL: There are no major deformities or cyanosis. NEUROLOGIC: No focal weakness or paresthesias are detected. SKIN: There are no ulcers or rashes noted. PSYCHIATRIC: The patient has a normal  affect.  DATA:  Noninvasive studies reveal triphasic waveforms bilaterally with an ankle arm index of 0.95 on the right and 1.0 on the left  MEDICAL ISSUES: Stable status post redo left to right femorofemoral bypass. He will continue his activities without limitation. We will see him in one year with repeat noninvasive studies. I again reviewed his signs and symptoms of graft occlusion we will notify us immediately should this occur No Follow-up on file.   Gretta Began [facility] [place]: [phone]

## 2016-05-06 ENCOUNTER — Other Ambulatory Visit: Payer: Self-pay | Admitting: Surgery

## 2016-07-29 ENCOUNTER — Encounter: Payer: Self-pay | Admitting: Vascular Surgery

## 2016-08-04 ENCOUNTER — Ambulatory Visit (INDEPENDENT_AMBULATORY_CARE_PROVIDER_SITE_OTHER): Payer: Medicare Other | Admitting: Vascular Surgery

## 2016-08-04 ENCOUNTER — Ambulatory Visit (HOSPITAL_COMMUNITY)
Admission: RE | Admit: 2016-08-04 | Discharge: 2016-08-04 | Disposition: A | Discharge status: Home / Self Care | Payer: Medicare Other | Source: Ambulatory Visit | Attending: Vascular Surgery | Admitting: Vascular Surgery

## 2016-08-04 ENCOUNTER — Encounter: Payer: Self-pay | Admitting: Vascular Surgery

## 2016-08-04 VITALS — BP 162/91 | HR 83 | Temp 97.1°F | Resp 18 | Ht 73.0 in | Wt 184.0 lb

## 2016-08-04 DIAGNOSIS — R9439 Abnormal result of other cardiovascular function study: Secondary | ICD-10-CM | POA: Diagnosis not present

## 2016-08-04 DIAGNOSIS — I739 Peripheral vascular disease, unspecified: Secondary | ICD-10-CM

## 2016-08-04 DIAGNOSIS — Z48812 Encounter for surgical aftercare following surgery on the circulatory system: Secondary | ICD-10-CM | POA: Insufficient documentation

## 2016-08-04 NOTE — Progress Notes (Signed)
[facility] of [place]  Patient name: David Horne MRN: [id number] DOB: 08/26/1946 Sex: male  REASON FOR VISIT: [facility]  HPI: David Horne is a 70 y.o. male here today for follow-up. He looks quite good today. He is status post redo left to right femorofemoral bypass on 09/10/2014. He had a prior a femorofemoral bypass [place] ago [place] and presented with recurrent ischemia. He reports no claudication symptoms and no rest pain or tissue loss. He has been stable from a cardiac and general health standpoint  Past Medical History:  Diagnosis Date  . 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 Atherosclerosis of native artery of right lower extremity with intermittent claudication (HCC)   . Esophageal reflux   . Hypertension     Family History  Problem Relation Age of Onset  . Deep vein thrombosis Sister     Blood Clots  . CVA Mother   . Heart disease Mother     SOCIAL HISTORY: Social History  Substance Use Topics  . Smoking status: Former Smoker    Quit date: 08/31/2000  . Smokeless tobacco: Never Used  . Alcohol use No    No Known Allergies  Current Outpatient Prescriptions  Medication Sig Dispense Refill  . amLODipine (NORVASC) 10 MG tablet Take 10 mg by mouth daily.    Marland Kitchen. ascorbic acid (VITAMIN C) 1000 MG tablet Take 1,000 mg by mouth daily.    Marland Kitchen. aspirin 81 MG tablet Take 81 mg by mouth daily.    . clopidogrel (PLAVIX) 75 MG tablet TAKE 1 TABLET BY MOUTH DAILY 30 tablet 6  . losartan (COZAAR) 50 MG tablet Take 50 mg by mouth daily.    . Multiple Vitamins-Minerals (CENTRUM SILVER PO) Take by mouth daily.    . Omega-3 Fatty Acids (FISH OIL) 1000 MG CAPS Take 4,000 mg by mouth daily.     Marland Kitchen. omeprazole (PRILOSEC) 20 MG capsule Take 20 mg by mouth daily.    Marland Kitchen. oxyCODONE-acetaminophen (PERCOCET/ROXICET) 5-325 MG per tablet Take 1-2 tablets by mouth  every 4 (four) hours as needed for moderate pain. (Patient not taking: Reported on 08/04/2016) 30 tablet 0   No current facility-administered medications for this visit.     REVIEW OF SYSTEMS:  [X]  denotes positive finding, [ ]  denotes negative finding Cardiac  Comments:  Chest pain or chest pressure:    Shortness of breath upon exertion:    Short of breath when lying flat:    Irregular heart rhythm:        Vascular    Pain in calf, thigh, or hip brought on by ambulation:    Pain in feet at night that wakes you up from your sleep:     Blood clot in your veins:    Leg swelling:           PHYSICAL EXAM: Vitals:   08/04/16 0958 08/04/16 1001  BP: (!) 178/93 (!) 162/91  Pulse: 83   Resp: 18   Temp: 97.1 F (36.2 C)   TempSrc: Oral   SpO2: 98%   Weight: 184 lb (83.5 kg)   Height: 6\' 1"  (1.854 m)     GENERAL: The patient is a well-nourished male, in no acute distress. The vital signs are documented above. CARDIOVASCULAR: 2+ femoral pulses bilateral and 2+ graft pulse. He does have palpable dorsalis pedis pulses bilaterally PULMONARY: There is good air exchange  MUSCULOSKELETAL: There are no major  deformities or cyanosis. NEUROLOGIC: No focal weakness or paresthesias are detected. SKIN: There are no ulcers or rashes noted. PSYCHIATRIC: The patient has a normal affect.  DATA:  Ankle arm index a stable 0.92 on the right and 0.97 on the left with triphasic posterior tibial waveforms bilaterally  MEDICAL ISSUES: Stable lower extremity flow with patent femorofemoral bypass. I have encouraged him to continue his exercise program. Plan to see him again on as-needed basis. He does check his graft pulse routinely and knows to notify should he develop any ischemic symptoms    Larina Earthlyodd F. Early, MD [facility] Office Tel [phone] Pager [phone]

## 2016-09-02 ENCOUNTER — Encounter: Payer: Self-pay | Admitting: Cardiology

## 2016-12-03 ENCOUNTER — Inpatient Hospital Stay (HOSPITAL_COMMUNITY): Payer: Medicare Other

## 2016-12-03 ENCOUNTER — Emergency Department (HOSPITAL_COMMUNITY): Payer: Medicare Other

## 2016-12-03 ENCOUNTER — Encounter (HOSPITAL_COMMUNITY): Payer: Self-pay | Admitting: Physician Assistant

## 2016-12-03 ENCOUNTER — Observation Stay (HOSPITAL_COMMUNITY)
Admission: EM | Admit: 2016-12-03 | Discharge: 2016-12-04 | Disposition: A | Discharge status: Home / Self Care | Payer: Medicare Other | Attending: Cardiology | Admitting: Cardiology

## 2016-12-03 DIAGNOSIS — I10 Essential (primary) hypertension: Secondary | ICD-10-CM | POA: Diagnosis present

## 2016-12-03 DIAGNOSIS — Z95820 Peripheral vascular angioplasty status with implants and grafts: Secondary | ICD-10-CM | POA: Diagnosis not present

## 2016-12-03 DIAGNOSIS — I2089 Other forms of angina pectoris: Secondary | ICD-10-CM

## 2016-12-03 DIAGNOSIS — Z87891 Personal history of nicotine dependence: Secondary | ICD-10-CM | POA: Diagnosis not present

## 2016-12-03 DIAGNOSIS — Z79899 Other long term (current) drug therapy: Secondary | ICD-10-CM | POA: Diagnosis not present

## 2016-12-03 DIAGNOSIS — E785 Hyperlipidemia, unspecified: Secondary | ICD-10-CM | POA: Insufficient documentation

## 2016-12-03 DIAGNOSIS — I129 Hypertensive chronic kidney disease with stage 1 through stage 4 chronic kidney disease, or unspecified chronic kidney disease: Secondary | ICD-10-CM | POA: Diagnosis not present

## 2016-12-03 DIAGNOSIS — N183 Chronic kidney disease, stage 3 unspecified: Secondary | ICD-10-CM

## 2016-12-03 DIAGNOSIS — R079 Chest pain, unspecified: Secondary | ICD-10-CM

## 2016-12-03 DIAGNOSIS — I200 Unstable angina: Secondary | ICD-10-CM | POA: Diagnosis present

## 2016-12-03 DIAGNOSIS — J449 Chronic obstructive pulmonary disease, unspecified: Secondary | ICD-10-CM | POA: Insufficient documentation

## 2016-12-03 DIAGNOSIS — Z7982 Long term (current) use of aspirin: Secondary | ICD-10-CM | POA: Diagnosis not present

## 2016-12-03 DIAGNOSIS — I739 Peripheral vascular disease, unspecified: Secondary | ICD-10-CM | POA: Insufficient documentation

## 2016-12-03 DIAGNOSIS — I209 Angina pectoris, unspecified: Secondary | ICD-10-CM | POA: Diagnosis not present

## 2016-12-03 DIAGNOSIS — I208 Other forms of angina pectoris: Secondary | ICD-10-CM

## 2016-12-03 DIAGNOSIS — R0609 Other forms of dyspnea: Secondary | ICD-10-CM

## 2016-12-03 HISTORY — DX: Other forms of angina pectoris: I20.8

## 2016-12-03 HISTORY — DX: Personal history of other diseases of the digestive system: Z87.19

## 2016-12-03 HISTORY — DX: Peripheral vascular disease, unspecified: I73.9

## 2016-12-03 HISTORY — DX: Personal history of peptic ulcer disease: Z87.11

## 2016-12-03 HISTORY — DX: Hyperlipidemia, unspecified: E78.5

## 2016-12-03 HISTORY — DX: Chronic kidney disease, stage 3 (moderate): N18.3

## 2016-12-03 HISTORY — DX: Chronic kidney disease, stage 3 unspecified: N18.30

## 2016-12-03 HISTORY — DX: Other forms of angina pectoris: I20.89

## 2016-12-03 LAB — CBC
HCT: 38.1 % — ABNORMAL LOW (ref 39.0–52.0)
Hemoglobin: 12.1 g/dL — ABNORMAL LOW (ref 13.0–17.0)
MCH: 26 pg (ref 26.0–34.0)
MCHC: 31.8 g/dL (ref 30.0–36.0)
MCV: 81.9 fL (ref 78.0–100.0)
Platelets: 243 10*3/uL (ref 150–400)
RBC: 4.65 MIL/uL (ref 4.22–5.81)
RDW: 15.3 % (ref 11.5–15.5)
WBC: 6.7 10*3/uL (ref 4.0–10.5)

## 2016-12-03 LAB — BASIC METABOLIC PANEL
ANION GAP: 8 (ref 5–15)
BUN: 13 mg/dL (ref 6–20)
CHLORIDE: 107 mmol/L (ref 101–111)
CO2: 24 mmol/L (ref 22–32)
Calcium: 8.8 mg/dL — ABNORMAL LOW (ref 8.9–10.3)
Creatinine, Ser: 1.44 mg/dL — ABNORMAL HIGH (ref 0.61–1.24)
GFR calc Af Amer: 55 mL/min — ABNORMAL LOW (ref >60)
GFR calc non Af Amer: 47 mL/min — ABNORMAL LOW (ref >60)
Glucose, Bld: 116 mg/dL — ABNORMAL HIGH (ref 65–99)
Potassium: 3.5 mmol/L (ref 3.5–5.1)
Sodium: 139 mmol/L (ref 135–145)

## 2016-12-03 LAB — I-STAT TROPONIN, ED
TROPONIN I, POC: 0 ng/mL (ref 0.00–0.08)
Troponin i, poc: 0 ng/mL (ref 0.00–0.08)

## 2016-12-03 LAB — TROPONIN I

## 2016-12-03 LAB — HEPARIN LEVEL (UNFRACTIONATED): HEPARIN UNFRACTIONATED: 0.47 [IU]/mL (ref 0.30–0.70)

## 2016-12-03 LAB — PROTIME-INR
INR: 1.1
PROTHROMBIN TIME: 14.2 s (ref 11.4–15.2)

## 2016-12-03 LAB — BRAIN NATRIURETIC PEPTIDE: B NATRIURETIC PEPTIDE 5: 35.2 pg/mL (ref 0.0–100.0)

## 2016-12-03 LAB — D-DIMER, QUANTITATIVE (NOT AT ARMC): D DIMER QUANT: 1.44 ug{FEU}/mL — AB (ref 0.00–0.50)

## 2016-12-03 MED ORDER — CENTRUM SILVER PO TABS: 1.0000 | ORAL_TABLET | Freq: Every day | ORAL | Status: DC

## 2016-12-03 MED ORDER — SODIUM CHLORIDE 0.9% FLUSH: 3.0000 mL | Freq: Two times a day (BID) | INTRAVENOUS | Status: DC

## 2016-12-03 MED ORDER — ACETAMINOPHEN 325 MG PO TABS: 650.0000 mg | ORAL_TABLET | ORAL | Status: DC | PRN

## 2016-12-03 MED ORDER — SODIUM CHLORIDE 0.9 % IV SOLN: 250.0000 mL | INTRAVENOUS | Status: DC | PRN

## 2016-12-03 MED ORDER — ADULT MULTIVITAMIN W/MINERALS CH
1.0000 | ORAL_TABLET | Freq: Every day | ORAL | Status: DC
  Administered 2016-12-04: 1 via ORAL
  Filled 2016-12-03 (×2): qty 1

## 2016-12-03 MED ORDER — HEPARIN BOLUS VIA INFUSION: 4000.0000 [IU] | Freq: Once | INTRAVENOUS | Status: DC

## 2016-12-03 MED ORDER — AMLODIPINE BESYLATE 10 MG PO TABS
10.0000 mg | ORAL_TABLET | Freq: Every day | ORAL | Status: DC
  Administered 2016-12-04: 10 mg via ORAL
  Filled 2016-12-03: qty 1
  Filled 2016-12-03: qty 2

## 2016-12-03 MED ORDER — HEPARIN (PORCINE) IN NACL 100-0.45 UNIT/ML-% IJ SOLN
1200.0000 [IU]/h | INTRAMUSCULAR | Status: DC
  Administered 2016-12-03: 1000 [IU]/h via INTRAVENOUS
  Filled 2016-12-03 (×2): qty 250

## 2016-12-03 MED ORDER — ASPIRIN 81 MG PO CHEW
324.0000 mg | CHEWABLE_TABLET | ORAL | Status: AC
  Administered 2016-12-04: 324 mg via ORAL
  Filled 2016-12-03: qty 4

## 2016-12-03 MED ORDER — METOPROLOL TARTRATE 25 MG PO TABS
25.0000 mg | ORAL_TABLET | Freq: Two times a day (BID) | ORAL | Status: DC
  Administered 2016-12-04: 25 mg via ORAL
  Filled 2016-12-03: qty 1

## 2016-12-03 MED ORDER — NITROGLYCERIN IN D5W 200-5 MCG/ML-% IV SOLN
0.0000 ug/min | INTRAVENOUS | Status: DC
  Administered 2016-12-03: 5 ug/min via INTRAVENOUS
  Filled 2016-12-03: qty 250

## 2016-12-03 MED ORDER — SODIUM CHLORIDE 0.9% FLUSH: 3.0000 mL | INTRAVENOUS | Status: DC | PRN

## 2016-12-03 MED ORDER — SODIUM CHLORIDE 0.9 % IV SOLN
INTRAVENOUS | Status: DC
  Administered 2016-12-03 – 2016-12-04 (×2): via INTRAVENOUS

## 2016-12-03 MED ORDER — PANTOPRAZOLE SODIUM 40 MG PO TBEC
40.0000 mg | DELAYED_RELEASE_TABLET | Freq: Every day | ORAL | Status: DC
  Administered 2016-12-03 – 2016-12-04 (×2): 40 mg via ORAL
  Filled 2016-12-03 (×2): qty 1

## 2016-12-03 MED ORDER — ASPIRIN EC 81 MG PO TBEC: 81.0000 mg | DELAYED_RELEASE_TABLET | Freq: Every day | ORAL | Status: DC

## 2016-12-03 MED ORDER — VITAMIN C 500 MG PO TABS
1000.0000 mg | ORAL_TABLET | Freq: Every day | ORAL | Status: DC
  Administered 2016-12-04: 1000 mg via ORAL
  Filled 2016-12-03 (×2): qty 2

## 2016-12-03 MED ORDER — ONDANSETRON HCL 4 MG/2ML IJ SOLN: 4.0000 mg | Freq: Four times a day (QID) | INTRAMUSCULAR | Status: DC | PRN

## 2016-12-03 MED ORDER — NITROGLYCERIN 0.4 MG SL SUBL: 0.4000 mg | SUBLINGUAL_TABLET | SUBLINGUAL | Status: DC | PRN

## 2016-12-03 MED ORDER — HEPARIN BOLUS VIA INFUSION
4000.0000 [IU] | Freq: Once | INTRAVENOUS | Status: AC
  Administered 2016-12-03: 4000 [IU] via INTRAVENOUS
  Filled 2016-12-03: qty 4000

## 2016-12-03 MED ORDER — TECHNETIUM TC 99M DIETHYLENETRIAME-PENTAACETIC ACID: 31.0000 | Freq: Once | INTRAVENOUS | Status: DC | PRN

## 2016-12-03 MED ORDER — ALPRAZOLAM 0.25 MG PO TABS: 0.2500 mg | ORAL_TABLET | Freq: Two times a day (BID) | ORAL | Status: DC | PRN

## 2016-12-03 MED ORDER — NITROGLYCERIN 0.4 MG SL SUBL
0.4000 mg | SUBLINGUAL_TABLET | SUBLINGUAL | Status: DC | PRN
  Administered 2016-12-03 (×2): 0.4 mg via SUBLINGUAL
  Filled 2016-12-03 (×2): qty 1

## 2016-12-03 MED ORDER — SODIUM CHLORIDE 0.9% FLUSH
3.0000 mL | Freq: Two times a day (BID) | INTRAVENOUS | Status: DC
  Administered 2016-12-04: 3 mL via INTRAVENOUS

## 2016-12-03 MED ORDER — ATORVASTATIN CALCIUM 80 MG PO TABS: 80.0000 mg | ORAL_TABLET | Freq: Every day | ORAL | Status: DC

## 2016-12-03 MED ORDER — OMEGA-3-ACID ETHYL ESTERS 1 G PO CAPS
2.0000 g | ORAL_CAPSULE | Freq: Two times a day (BID) | ORAL | Status: DC
  Administered 2016-12-04: 2 g via ORAL
  Filled 2016-12-03: qty 2

## 2016-12-03 MED ORDER — TECHNETIUM TO 99M ALBUMIN AGGREGATED
4.0000 | Freq: Once | INTRAVENOUS | Status: AC | PRN
  Administered 2016-12-03: 4 via INTRAVENOUS

## 2016-12-03 MED ORDER — ASPIRIN 81 MG PO CHEW: 81.0000 mg | CHEWABLE_TABLET | Freq: Once | ORAL | Status: DC

## 2016-12-03 MED ORDER — ZOLPIDEM TARTRATE 5 MG PO TABS: 5.0000 mg | ORAL_TABLET | Freq: Every evening | ORAL | Status: DC | PRN

## 2016-12-03 MED ORDER — ASPIRIN 81 MG PO CHEW
162.0000 mg | CHEWABLE_TABLET | Freq: Once | ORAL | Status: AC
  Administered 2016-12-03: 162 mg via ORAL
  Filled 2016-12-03: qty 2

## 2016-12-03 NOTE — ED Triage Notes (Addendum)
Pt arrived POV with c/o L sided CP onset 0530, awoke pt from sleep, non radiating, sharp, pt states he became shob, diaphoretic, denies n/v.Pt seen 11/20/16 for similar episode. Pt states L arm feel numb, full movement noted and patient denies radiation from chest.

## 2016-12-03 NOTE — H&P (Signed)
History and Physical   Patient ID: David Horne MRN: [id number], DOB/AGE: [id number] y.o. Date of Encounter: 12/03/2016  Primary Physician: Avis Epley, MD at the [facility] Primary Cardiologist: [place] is setting up cards MD there.  Chief Complaint:  CP  HPI: [name] is a 71 y.o. male with a history of PAD w/ L-R fem-fem and redo 2016, HTN, GERD. Had a stress test prior to re-do fem-fem, ok per pt.   He went to [facility] on 11/19/2016 because he wakened by SOB. He was seen at the [facility] 11/30/2016, and they are scheduling a breathing test and a stress test.  Pt is normally active, using a chain saw, he builds things and does work around the house and yard without problems. However, ever since the episode of SOB on the 22nd, he has noticed increased DOE. He feels like he can only work about 5 minutes without stopping to rest. No LE edema, no orthopnea, no PND.  Pt was otherwise in USOH last pm. He was wakened by CP about 5:15. It was sharp, steady, L arm felt numb. 5-6/10. He was SOB, diaphoretic, nauseated. He took an 81 mg ASA. His daughter drove him to the ER.  In the ER, he got 162 mg ASA and SL NTG x 2. He is on IV NTG. His chest pain is now a zero, but his L arm still aches a little. He has never had anything like this before.   Past Medical History:  Diagnosis Date  . Anginal chest pain at rest [facility]) 12/03/2016  . 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 Atherosclerosis of native artery of right lower extremity with intermittent claudication (HCC)   . CKD (chronic kidney disease), stage III   . Dyslipidemia 02/06/2013  . Esophageal reflux   . Hypertension     Surgical History:  Past Surgical History:  Procedure Laterality Date  . ABDOMINAL AORTAGRAM N/A 08/06/2014   Procedure: ABDOMINAL Ronny Flurry;  Surgeon: Chuck Hint, MD;  Location: [facility] CATH LAB;  Service: Cardiovascular;  Laterality: N/A;  . FEMORAL ARTERY - FEMORAL ARTERY BYPASS  GRAFT  2005   Left to Right   . FEMORAL-FEMORAL BYPASS GRAFT Bilateral 09/10/2014   Procedure: REDO BYPASS GRAFT LEFT FEMORAL-RIGHT FEMORAL ARTERY;  Surgeon: Larina Earthly, MD;  Location: [facility] OR;  Service: Vascular;  Laterality: Bilateral;     I have reviewed the patient's current medications. Prior to Admission medications   Medication Sig Start Date End Date Taking? Authorizing Provider  amLODipine (NORVASC) 10 MG tablet Take 10 mg by mouth daily.   Yes Historical Provider, MD  ascorbic acid (VITAMIN C) 1000 MG tablet Take 1,000 mg by mouth daily.   Yes Historical Provider, MD  aspirin 81 MG tablet Take 81 mg by mouth daily.   Yes Historical Provider, MD  isosorbide mononitrate (IMDUR) 60 MG 24 hr tablet Take 60 mg by mouth daily.   Yes Historical Provider, MD  losartan (COZAAR) 100 MG tablet Take 100 mg by mouth daily.    Yes Historical Provider, MD  Multiple Vitamins-Minerals (CENTRUM SILVER PO) Take 1 tablet by mouth daily.    Yes Historical Provider, MD  Omega-3 Fatty Acids (FISH OIL) 1000 MG CAPS Take 2,000 mg by mouth 2 (two) times daily.    Yes Historical Provider, MD  omeprazole (PRILOSEC) 20 MG capsule Take 20 mg by mouth daily.   Yes Historical Provider, MD  clopidogrel (PLAVIX) 75 MG tablet  TAKE 1 TABLET BY MOUTH DAILY Patient not taking: Reported on 12/03/2016 05/06/16   Nada [name], MD  oxyCODONE-acetaminophen (PERCOCET/ROXICET) 5-325 MG per tablet Take 1-2 tablets by mouth every 4 (four) hours as needed for moderate pain. Patient not taking: Reported on 08/04/2016 09/11/14   [name], PA-C   Scheduled Meds: . heparin  4,000 Units Intravenous Once   Continuous Infusions: . heparin    . nitroGLYCERIN 5 mcg/min (12/03/16 0928)   PRN Meds:.nitroGLYCERIN  Allergies: No Known Allergies  Social History   Social History  . Marital status: Widowed    Spouse name: N/A  . Number of children: N/A  . Years of education: N/A   Occupational History  . Retired Naval architect     Social History Main Topics  . Smoking status: Former Smoker    Quit date: 08/31/2000  . Smokeless tobacco: Never Used  . Alcohol use No  . Drug use: No  . Sexual activity: Not on file   Other Topics Concern  . Not on file   Social History Narrative   Pt lives with 4 dogs. Good family support nearby.    Family History  Problem Relation Age of Onset  . Deep vein thrombosis Sister     Blood Clots  . CVA Mother   . Heart disease Mother    Family Status  Relation Status  . Sister Alive  . Mother     Review of Systems:   Full 14-point review of systems otherwise negative except as noted above.  Physical Exam: Blood pressure (!) 163/86, pulse 75, temperature 97.5 F (36.4 C), temperature source Oral, resp. rate 16, height  (1.854 m), weight 184 lb (83.5 kg), SpO2 93 %. General: Well developed, well nourished,male in no acute distress. Head: Normocephalic, atraumatic, sclera non-icteric, no xanthomas, nares are without discharge. Dentition: dentures Neck: No carotid bruits. JVD not elevated. No thyromegally Lungs: Good expansion bilaterally. without wheezes or rhonchi.  Heart: Regular rate and rhythm with S1 S2.  No S3 or S4.  No murmur, no rubs, or gallops appreciated. Abdomen: Soft, non-tender, non-distended with normoactive bowel sounds. No hepatomegaly. No rebound/guarding. No obvious abdominal masses. Msk:  Strength and tone appear normal for age. No joint deformities or effusions, no spine or costo-vertebral angle tenderness. Extremities: No clubbing or cyanosis. No edema.  Distal pedal pulses are 1-2+ in 4 extrem. L DP weak but PT is 2+. Neuro: Alert and oriented X 3. Moves all extremities spontaneously. No focal deficits noted. Psych:  Responds to questions appropriately with a normal affect. Skin: No rashes or lesions noted  Labs:   Lab Results  Component Value Date   WBC 6.7 12/03/2016   HGB 12.1 (L) 12/03/2016   HCT 38.1 (L) 12/03/2016   MCV 81.9  12/03/2016   PLT 243 12/03/2016    Recent Labs Lab 12/03/16 0645  NA 139  K 3.5  CL 107  CO2 24  BUN 13  CREATININE 1.44*  CALCIUM 8.8*  GLUCOSE 116*    Recent Labs  12/03/16 0651  TROPIPOC 0.00   B Natriuretic Peptide  Date/Time Value Ref Range Status  12/03/2016 06:45 AM 35.2 0.0 - 100.0 pg/mL Final    Radiology/Studies: Dg Chest 2 View Result Date: 12/03/2016 CLINICAL DATA:  Left-sided chest pain. EXAM: CHEST  2 VIEW COMPARISON:  No prior . FINDINGS: Mediastinum hilar structures are normal. Mild cardiomegaly. No pulmonary venous congestion. Mild bilateral interstitial prominence. These changes may be chronic. Active interstitial process  including pneumonitis or interstitial edema cannot be excluded. COPD. No pleural effusion or pneumothorax. No acute bony abnormality. IMPRESSION: 1. Mild bilateral interstitial prominence. Although these changes may be chronic an active interstitial process including pneumonitis or interstitial edema cannot be excluded. 2. COPD. 3. Mild cardiomegaly. Electronically Signed   By: Maisie Fus  Register   On: 12/03/2016 07:54   Echo: n/a  ECG: 04/05 SR, no acute ischemic changes, no Q waves  ASSESSMENT AND PLAN:  Principal Problem:   Anginal chest pain at rest [facility]) - sx are very concerning for ACS - mult CRFs including HTN, HLD, PAD, age, gender - pt w/ no previous CP, but has been having DOE x >2 weeks, after waking w/ SOB - give 1 more ASA 81 mg now - add heparin, continue IV NTG - add BB, high-dose statin - cardiac cath is the best option, will require hydration first, schedule for am. - pt wife died 12/10/15 after she coded during a heart cath at [facility], pt w/ NSTEMI. She was resuscitated but had had a large CVA, did not survive. This is causing the family a great deal of stress.  Active Problems:   HTN, goal below 130/80 - will need to hold Cozaar for cath - continue amlodipine - add BB and follow BP/HR    CKD (chronic kidney  disease), stage III - gentle hydration, recheck in am    Dyslipidemia - high-dose statin - ck LFTs and profile    PAD (peripheral artery disease) (HCC) - distal pulses intact  Signed, Theodore Demark, PA-C 12/03/2016 11:08 AM Beeper [phone]  As above, patient seen and examined. Briefly he is a 71 year old male with past medical history of hypertension, hyperlipidemia, peripheral vascular disease, chronic stage III kidney disease with chest pain and dyspnea. No prior cardiac history. On 2024-04-11patient awoke from sleep with significant dyspnea but no chest pain. Symptoms lasted approximately 3 hours. He was apparently seen at [facility] and workup negative. Since that time he has had dyspnea with any activities. No orthopnea, PND, pedal edema or syncope. This morning he again awoke with dyspnea that there was a sharp chest pain and numbness in his left upper extremity. Symptoms lasted approximately 3 hours and resolved with nitroglycerin. Presently pain-free. No recent travel, leg injury or surgery.  Chest x-ray shows COPD. Creatinine 1.44. BNP 35. Troponin 0. Electrocardiogram shows sinus rhythm with minor nonspecific ST changes.  1 dyspnea/chest pain-symptoms are extremely concerning. He has multiple risk factors including hypertension, hyperlipidemia and prior tobacco abuse. He also has peripheral vascular disease with prior peripheral vascular surgery. He has bilateral femoral bruits. I think definitive evaluation is warranted. Plan cardiac catheterization tomorrow (would like to hydrate given baseline renal insufficiency). The risks including myocardial infarction, CVA and death were discussed and he agrees to proceed. I also discussed the potential for renal insufficiency. Note his wife had a large CVA with a cardiac catheterization in 04/18/17and died. He is extremely concerned but agreeable to proceed. Patient does not have risk factors for pulmonary embolus but I will check a  d-dimer. If abnormal he would need a VQ scan. We will continue aspirin, nitrates and add heparin. Given documented vascular disease add Lipitor 80 mg daily. Add metoprolol 12.5 mg twice a day. Schedule echocardiogram to assess LV function.  2 peripheral vascular disease-continue aspirin. Add statin.  3 hypertension-continue preadmission blood pressure medications. Will hold Cozaar prior to catheterization.   4 chronic stage III kidney disease-we will hold  Cozaar tomorrow morning prior to catheterization and resume after. Hydrate prior to procedure. No ventriculogram. Follow renal function after procedure.   Olga Millers, MD

## 2016-12-03 NOTE — ED Notes (Signed)
Theodore Demark, PA notified that d dimer resulted at 1.44

## 2016-12-03 NOTE — ED Notes (Signed)
Pt states he took ASA  x 1 before leaving home this am.

## 2016-12-03 NOTE — ED Notes (Signed)
Patient taken to [facility]

## 2016-12-03 NOTE — ED Notes (Signed)
Pt state he is  Chest pain free. Will inform MD.

## 2016-12-03 NOTE — Progress Notes (Signed)
ANTICOAGULATION CONSULT NOTE  Pharmacy Consult for heparin Indication: chest pain/ACS  No Known Allergies  Patient Measurements: Height:  (185.4 cm) Weight: 185 lb (83.9 kg) IBW/kg (Calculated) : 79.9 Heparin Dosing Weight: 83.5 Kg  Vital Signs: Temp: 97.7 F (36.5 C) (04/05 1420) Temp Source: Oral (04/05 1420) BP: 173/78 (04/05 1420) Pulse Rate: 69 (04/05 1420)  Labs:  Recent Labs  12/03/16 0645 12/03/16 1504  HGB 12.1*  --   HCT 38.1*  --   PLT 243  --   HEPARINUNFRC  --  0.47  CREATININE 1.44*  --    Estimated Creatinine Clearance: 53.2 mL/min (A) (by C-G formula based on SCr of 1.44 mg/dL (H)).  Medical History: Past Medical History:  Diagnosis Date  . Anginal chest pain at rest [facility]) 12/03/2016  . 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 Atherosclerosis of native artery of right lower extremity with intermittent claudication (HCC)   . CKD (chronic kidney disease), stage III   . Dyslipidemia 02/06/2013  . Esophageal reflux   . Hypertension     Assessment: 71 year old male with PAD s/p bypass presenting with chest pain. Patient is not on any anticoagulation prior to admission. Recently taken off Plavix about 1 week ago at the [place]. HgB 12.1, PLT WNL  Initial heparin level therapeutic at 0.47. No bleed issues reported.  Goal of Therapy:  Heparin level 0.3-0.7 units/ml Monitor platelets by anticoagulation protocol: Yes   Plan:  Continue heparin infusion at 1000 units/hr Check anti-Xa level in 6 hours to confirm and daily while on heparin Continue to monitor H&H and platelets, s/sx bleeding   Babs Bertin, PharmD, BCPS Clinical Pharmacist 12/03/2016 4:03 PM

## 2016-12-03 NOTE — ED Provider Notes (Signed)
[facility]-EMERGENCY DEPT Provider Note   CSN: [id number] Arrival date & time: 12/03/16  0630     History   Chief Complaint Chief Complaint  Patient presents with  . Chest Pain    HPI David Horne is a 71 y.o. male.  The history is provided by the patient and medical records.  Chest Pain   Associated symptoms include diaphoresis and shortness of breath.    71 year old male with history of esophageal reflux, hypertension, peripheral arterial disease status post bypass, presenting to the ED for chest pain. Patient reports this began around 5:15 this morning, it woke him from sleep. Pain is described as a dull ache with pressure to the left side of his chest and into left arm. He reports associated shortness of breath as well as diaphoresis. He denies any nausea, dizziness, numbness, or feelings of syncope. He denies any known cardiac history. Former smoker, quit in 2002. Reports he has had multiple episodes of this recently. He was seen and evaluated at a hospital in [place] on 11/19/16-- states he was seen in the ED with work-up that showed some mild abnormalities-- he is not sure exactly what.  He was seen again by the [facility] on 12/01/16 and started on isosorbide due to recurrent pain.  States he has taken 1 dose of the medication, no change in symptoms.  Of note, patient taken off his plavix about a week ago by the [place].  Patient's daughter did pull me into the hallway to speak privately. She reports that her mother passed away approx 1 year ago.  She notes that patient's symptoms began the very day that her mother was admitted to the hospital 1 year ago prior to her passing.  States he has not mentioned anything about this, but she is concerned this may be contributing.  Past Medical History:  Diagnosis Date  . 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 Atherosclerosis of native artery of right lower extremity with intermittent claudication (HCC)   . Esophageal  reflux   . Hypertension     Patient Active Problem List   Diagnosis Date Noted  . 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 PVD (peripheral vascular disease) (HCC) 08/06/2014    Past Surgical History:  Procedure Laterality Date  . ABDOMINAL AORTAGRAM N/A 08/06/2014   Procedure: ABDOMINAL Ronny Flurry;  Surgeon: Chuck Hint, MD;  Location: [facility] CATH LAB;  Service: Cardiovascular;  Laterality: N/A;  . FEMORAL ARTERY - FEMORAL ARTERY BYPASS GRAFT  2005   Left to Right   . FEMORAL-FEMORAL BYPASS GRAFT Bilateral 09/10/2014   Procedure: REDO BYPASS GRAFT LEFT FEMORAL-RIGHT FEMORAL ARTERY;  Surgeon: Larina Earthly, MD;  Location: [facility] OR;  Service: Vascular;  Laterality: Bilateral;       Home Medications    Prior to Admission medications   Medication Sig Start Date End Date Taking? Authorizing Provider  amLODipine (NORVASC) 10 MG tablet Take 10 mg by mouth daily.    Historical Provider, MD  ascorbic acid (VITAMIN C) 1000 MG tablet Take 1,000 mg by mouth daily.    Historical Provider, MD  aspirin 81 MG tablet Take 81 mg by mouth daily.    Historical Provider, MD  clopidogrel (PLAVIX) 75 MG tablet TAKE 1 TABLET BY MOUTH DAILY 05/06/16   Nada [name], MD  losartan (COZAAR) 50 MG tablet Take 50 mg by mouth daily.    Historical Provider, MD  Multiple Vitamins-Minerals (CENTRUM SILVER PO) Take by mouth daily.  Historical Provider, MD  Omega-3 Fatty Acids (FISH OIL) 1000 MG CAPS Take 4,000 mg by mouth daily.     Historical Provider, MD  omeprazole (PRILOSEC) 20 MG capsule Take 20 mg by mouth daily.    Historical Provider, MD  oxyCODONE-acetaminophen (PERCOCET/ROXICET) 5-325 MG per tablet Take 1-2 tablets by mouth every 4 (four) hours as needed for moderate pain. Patient not taking: Reported on 08/04/2016 09/11/14   [name], PA-C    Family History Family History  Problem Relation Age of Onset  . Deep vein thrombosis Sister     Blood Clots  . CVA Mother   . Heart disease Mother      Social History Social History  Substance Use Topics  . Smoking status: Former Smoker    Quit date: 08/31/2000  . Smokeless tobacco: Never Used  . Alcohol use No     Allergies   Patient has no known allergies.   Review of Systems Review of Systems  Constitutional: Positive for diaphoresis.  Respiratory: Positive for shortness of breath.   Cardiovascular: Positive for chest pain.  All other systems reviewed and are negative.    Physical Exam Updated Vital Signs BP (!) 158/88 (BP Location: Right Arm)   Pulse 74   Temp 97.5 F (36.4 C) (Oral)   Resp 16   Ht  (1.854 m)   Wt 83.5 kg   SpO2 100%   BMI 24.28 kg/m   Physical Exam  Constitutional: He is oriented to person, place, and time. He appears well-developed and well-nourished.  HENT:  Head: Normocephalic and atraumatic.  Mouth/Throat: Oropharynx is clear and moist.  Eyes: Conjunctivae and EOM are normal. Pupils are equal, round, and reactive to light.  Neck: Normal range of motion.  Cardiovascular: Normal rate, regular rhythm and normal heart sounds.   Pulmonary/Chest: Effort normal and breath sounds normal. No respiratory distress. He has no wheezes.  Lungs overall clear, speaking in full sentences without difficulty, no apparent distress  Abdominal: Soft. Bowel sounds are normal. There is no tenderness. There is no rebound.  Musculoskeletal: Normal range of motion.  Neurological: He is alert and oriented to person, place, and time.  Skin: Skin is warm and dry.  Psychiatric: He has a normal mood and affect.  Nursing note and vitals reviewed.    ED Treatments / Results  Labs (all labs ordered are listed, but only abnormal results are displayed) Labs Reviewed  BASIC METABOLIC PANEL - Abnormal; Notable for the following:       Result Value   Glucose, Bld 116 (*)    Creatinine, Ser 1.44 (*)    Calcium 8.8 (*)    GFR calc non Af Amer 47 (*)    GFR calc Af Amer 55 (*)    All other components within  normal limits  CBC - Abnormal; Notable for the following:    Hemoglobin 12.1 (*)    HCT 38.1 (*)    All other components within normal limits  BRAIN NATRIURETIC PEPTIDE  I-STAT TROPOININ, ED    EKG  EKG Interpretation  Date/Time:  Thursday December 03 2016 06:39:31 EDT Ventricular Rate:  84 PR Interval:    QRS Duration: 85 QT Interval:  396 QTC Calculation: 469 R Axis:   66 Text Interpretation:  Sinus rhythm Abnormal R-wave progression, early transition Baseline wander in lead(s) V3 No significant change was found Confirmed by Manus Gunning  MD, STEPHEN [phone]) on 12/03/2016 6:51:41 AM       Radiology Dg Chest  2 View  Result Date: 12/03/2016 CLINICAL DATA:  Left-sided chest pain. EXAM: CHEST  2 VIEW COMPARISON:  No prior . FINDINGS: Mediastinum hilar structures are normal. Mild cardiomegaly. No pulmonary venous congestion. Mild bilateral interstitial prominence. These changes may be chronic. Active interstitial process including pneumonitis or interstitial edema cannot be excluded. COPD. No pleural effusion or pneumothorax. No acute bony abnormality. IMPRESSION: 1. Mild bilateral interstitial prominence. Although these changes may be chronic an active interstitial process including pneumonitis or interstitial edema cannot be excluded. 2. COPD. 3. Mild cardiomegaly. Electronically Signed   By: Maisie Fus  Register   On: 12/03/2016 07:54    Procedures Procedures (including critical care time)  CRITICAL CARE Performed by: Garlon Hatchet   Total critical care time: 45 minutes  Critical care time was exclusive of separately billable procedures and treating other patients.  Critical care was necessary to treat or prevent imminent or life-threatening deterioration.  Critical care was time spent personally by me on the following activities: development of treatment plan with patient and/or surrogate as well as nursing, discussions with consultants, evaluation of patient's response to treatment,  examination of patient, obtaining history from patient or surrogate, ordering and performing treatments and interventions, ordering and review of laboratory studies, ordering and review of radiographic studies, pulse oximetry and re-evaluation of patient's condition.   Medications Ordered in ED Medications  nitroGLYCERIN 50 mg in dextrose 5 % 250 mL (0.2 mg/mL) infusion (5 mcg/min Intravenous New Bag/Given 12/03/16 0928)  heparin ADULT infusion 100 units/mL (25000 units/283mL sodium chloride 0.45%) (1,000 Units/hr Intravenous New Bag/Given 12/03/16 1121)  aspirin chewable tablet 81 mg (not administered)  amLODipine (NORVASC) tablet 10 mg (not administered)  vitamin C (ASCORBIC ACID) tablet 1,000 mg (not administered)  aspirin tablet 81 mg (not administered)  CENTRUM SILVER 1 tablet (not administered)  omega-3 acid ethyl esters (LOVAZA) capsule 2 g (not administered)  pantoprazole (PROTONIX) EC tablet 40 mg (not administered)  0.9 %  sodium chloride infusion (not administered)  aspirin chewable tablet 162 mg (162 mg Oral Given 12/03/16 0719)  heparin bolus via infusion 4,000 Units (4,000 Units Intravenous Bolus from Bag 12/03/16 1123)     Initial Impression / Assessment and Plan / ED Course  I have reviewed the triage vital signs and the nursing notes.  Pertinent labs & imaging results that were available during my care of the patient were reviewed by me and considered in my medical decision making (see chart for details).  71 year old male here with chest pain. Has been intermittent for a few days now. This is his third evaluation in the past 2 weeks.  Reports pain woke him from sleep this morning with associated diaphoresis and shortness of breath. Did take 1 aspirin. Exam here overall reassuring. Vital signs are stable. BP mildly elevated above his baseline, however he does report "white coat syndrome".  Patient given additional aspirin, nitroglycerin. We'll plan for labs, chest x-ray.  8:36  AM Patient reassessed.  Discussed lab and x-ray findings.  States he has had 2 other episodes of chest pain after the SL NTG, reports continuous pain in the left arm.  Concern for unstable angina.  Will start on heparing with nitro drip.  Will discuss with cardiology for admission.  May ultimately need heart cath.  Cardiology has evaluated patient, will admit for ongoing care.    Final Clinical Impressions(s) / ED Diagnoses   Final diagnoses:  Chest pain, unspecified type    New Prescriptions New Prescriptions   No medications  on file     Garlon Hatchet, PA-C 12/03/16 1347    Derwood Kaplan, MD 12/04/16 [phone]

## 2016-12-03 NOTE — ED Notes (Signed)
Pt reports brief interval of chest pain with SHOB. Denies symptoms now. MD notified.

## 2016-12-03 NOTE — Progress Notes (Signed)
ANTICOAGULATION CONSULT NOTE - Initial Consult  Pharmacy Consult for heparin Indication: chest pain/ACS  No Known Allergies  Patient Measurements: Height:  (185.4 cm) Weight: 184 lb (83.5 kg) IBW/kg (Calculated) : 79.9 Heparin Dosing Weight: 83.5 Kg  Vital Signs: Temp: 97.5 F (36.4 C) (04/05 0642) Temp Source: Oral (04/05 0642) BP: 135/79 (04/05 0815) Pulse Rate: 66 (04/05 0815)  Labs:  Recent Labs  12/03/16 0645  HGB 12.1*  HCT 38.1*  PLT 243  CREATININE 1.44*   Estimated Creatinine Clearance: 53.2 mL/min (A) (by C-G formula based on SCr of 1.44 mg/dL (H)).  Medical History: Past Medical History:  Diagnosis Date  . 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 Atherosclerosis of native artery of right lower extremity with intermittent claudication (HCC)   . Esophageal reflux   . Hypertension     Assessment: 71 year old male with PAD s/p bypass presenting with chest pain. Patient is not on any anticoagulation prior to admission. Recently taken off Plavix about 1 week ago at the [place]. HgB 12.1, PLT WNL  Goal of Therapy:  Heparin level 0.3-0.7 units/ml Monitor platelets by anticoagulation protocol: Yes   Plan:  Give 4000 units bolus x 1 Start heparin infusion at 1000 units/hr Check anti-Xa level in 6 hours and daily while on heparin Continue to monitor H&H and platelets  David Horne 12/03/2016,8:54 AM

## 2016-12-04 ENCOUNTER — Encounter (HOSPITAL_COMMUNITY): Payer: Self-pay | Admitting: Interventional Cardiology

## 2016-12-04 ENCOUNTER — Encounter (HOSPITAL_COMMUNITY)
Admission: EM | Disposition: A | Discharge status: Home / Self Care | Payer: Self-pay | Source: Home / Self Care | Attending: Emergency Medicine

## 2016-12-04 ENCOUNTER — Other Ambulatory Visit (HOSPITAL_COMMUNITY): Payer: Medicare Other

## 2016-12-04 ENCOUNTER — Other Ambulatory Visit: Payer: Self-pay | Admitting: Cardiology

## 2016-12-04 DIAGNOSIS — R0789 Other chest pain: Secondary | ICD-10-CM

## 2016-12-04 DIAGNOSIS — I208 Other forms of angina pectoris: Secondary | ICD-10-CM | POA: Diagnosis not present

## 2016-12-04 DIAGNOSIS — I739 Peripheral vascular disease, unspecified: Secondary | ICD-10-CM

## 2016-12-04 DIAGNOSIS — N183 Chronic kidney disease, stage 3 unspecified: Secondary | ICD-10-CM

## 2016-12-04 DIAGNOSIS — R0600 Dyspnea, unspecified: Secondary | ICD-10-CM

## 2016-12-04 DIAGNOSIS — I10 Essential (primary) hypertension: Secondary | ICD-10-CM

## 2016-12-04 HISTORY — PX: LEFT HEART CATH AND CORONARY ANGIOGRAPHY: CATH118249

## 2016-12-04 LAB — COMPREHENSIVE METABOLIC PANEL
ALBUMIN: 3.1 g/dL — AB (ref 3.5–5.0)
ALK PHOS: 102 U/L (ref 38–126)
ALT: 21 U/L (ref 17–63)
AST: 21 U/L (ref 15–41)
Anion gap: 10 (ref 5–15)
BILIRUBIN TOTAL: 0.2 mg/dL — AB (ref 0.3–1.2)
BUN: 8 mg/dL (ref 6–20)
CALCIUM: 8.5 mg/dL — AB (ref 8.9–10.3)
CO2: 22 mmol/L (ref 22–32)
Chloride: 109 mmol/L (ref 101–111)
Creatinine, Ser: 1.25 mg/dL — ABNORMAL HIGH (ref 0.61–1.24)
GFR calc Af Amer: >60 mL/min (ref >60)
GFR calc non Af Amer: 56 mL/min — ABNORMAL LOW (ref >60)
Glucose, Bld: 114 mg/dL — ABNORMAL HIGH (ref 65–99)
POTASSIUM: 3.5 mmol/L (ref 3.5–5.1)
SODIUM: 141 mmol/L (ref 135–145)
TOTAL PROTEIN: 6.6 g/dL (ref 6.5–8.1)

## 2016-12-04 LAB — LIPID PANEL
CHOLESTEROL: 185 mg/dL (ref 0–200)
HDL: 36 mg/dL — ABNORMAL LOW (ref >40)
LDL Cholesterol: 115 mg/dL — ABNORMAL HIGH (ref 0–99)
Total CHOL/HDL Ratio: 5.1 RATIO
Triglycerides: 172 mg/dL — ABNORMAL HIGH (ref <150)
VLDL: 34 mg/dL (ref 0–40)

## 2016-12-04 LAB — HEMOGLOBIN A1C
HEMOGLOBIN A1C: 5.8 % — AB (ref 4.8–5.6)
MEAN PLASMA GLUCOSE: 120 mg/dL

## 2016-12-04 LAB — TROPONIN I
Troponin I: <0.03 ng/mL (ref <0.03)
Troponin I: <0.03 ng/mL (ref <0.03)

## 2016-12-04 LAB — HEPARIN LEVEL (UNFRACTIONATED): HEPARIN UNFRACTIONATED: 0.2 [IU]/mL — AB (ref 0.30–0.70)

## 2016-12-04 SURGERY — LEFT HEART CATH AND CORONARY ANGIOGRAPHY
Anesthesia: LOCAL

## 2016-12-04 MED ORDER — HEPARIN SODIUM (PORCINE) 1000 UNIT/ML IJ SOLN
INTRAMUSCULAR | Status: DC | PRN
  Administered 2016-12-04: 4500 [IU] via INTRAVENOUS

## 2016-12-04 MED ORDER — MIDAZOLAM HCL 2 MG/2ML IJ SOLN
INTRAMUSCULAR | Status: AC
  Filled 2016-12-04: qty 2

## 2016-12-04 MED ORDER — FENTANYL CITRATE (PF) 100 MCG/2ML IJ SOLN
INTRAMUSCULAR | Status: DC | PRN
  Administered 2016-12-04: 25 ug via INTRAVENOUS

## 2016-12-04 MED ORDER — LIDOCAINE HCL (PF) 1 % IJ SOLN
INTRAMUSCULAR | Status: AC
  Filled 2016-12-04: qty 30

## 2016-12-04 MED ORDER — SODIUM CHLORIDE 0.9% FLUSH: 3.0000 mL | Freq: Two times a day (BID) | INTRAVENOUS | Status: DC

## 2016-12-04 MED ORDER — HEPARIN SODIUM (PORCINE) 1000 UNIT/ML IJ SOLN
INTRAMUSCULAR | Status: AC
  Filled 2016-12-04: qty 1

## 2016-12-04 MED ORDER — FENTANYL CITRATE (PF) 100 MCG/2ML IJ SOLN
INTRAMUSCULAR | Status: AC
  Filled 2016-12-04: qty 2

## 2016-12-04 MED ORDER — HEPARIN (PORCINE) IN NACL 2-0.9 UNIT/ML-% IJ SOLN
INTRAMUSCULAR | Status: AC
  Filled 2016-12-04: qty 1000

## 2016-12-04 MED ORDER — ACETAMINOPHEN 325 MG PO TABS: 650.0000 mg | ORAL_TABLET | ORAL | Status: DC | PRN

## 2016-12-04 MED ORDER — LOSARTAN POTASSIUM 50 MG PO TABS
100.0000 mg | ORAL_TABLET | Freq: Every day | ORAL | Status: DC
  Administered 2016-12-04: 100 mg via ORAL
  Filled 2016-12-04: qty 2

## 2016-12-04 MED ORDER — SODIUM CHLORIDE 0.9 % IV SOLN
INTRAVENOUS | Status: AC
  Administered 2016-12-04: 09:00:00 via INTRAVENOUS

## 2016-12-04 MED ORDER — ONDANSETRON HCL 4 MG/2ML IJ SOLN: 4.0000 mg | Freq: Four times a day (QID) | INTRAMUSCULAR | Status: DC | PRN

## 2016-12-04 MED ORDER — VERAPAMIL HCL 2.5 MG/ML IV SOLN
INTRAVENOUS | Status: AC
  Filled 2016-12-04: qty 2

## 2016-12-04 MED ORDER — IOPAMIDOL (ISOVUE-370) INJECTION 76%
INTRAVENOUS | Status: AC
  Filled 2016-12-04: qty 100

## 2016-12-04 MED ORDER — LIDOCAINE HCL (PF) 1 % IJ SOLN
INTRAMUSCULAR | Status: DC | PRN
  Administered 2016-12-04: 2 mL via INTRADERMAL

## 2016-12-04 MED ORDER — MIDAZOLAM HCL 2 MG/2ML IJ SOLN
INTRAMUSCULAR | Status: DC | PRN
  Administered 2016-12-04: 2 mg via INTRAVENOUS

## 2016-12-04 MED ORDER — HEPARIN (PORCINE) IN NACL 2-0.9 UNIT/ML-% IJ SOLN
INTRAMUSCULAR | Status: DC | PRN
  Administered 2016-12-04: 1000 mL

## 2016-12-04 MED ORDER — IOPAMIDOL (ISOVUE-370) INJECTION 76%
INTRAVENOUS | Status: DC | PRN
  Administered 2016-12-04: 50 mL via INTRA_ARTERIAL

## 2016-12-04 MED ORDER — ATORVASTATIN CALCIUM 80 MG PO TABS: 80.0000 mg | ORAL_TABLET | Freq: Every day | ORAL | 6 refills | Status: DC

## 2016-12-04 MED ORDER — SODIUM CHLORIDE 0.9 % IV SOLN: 250.0000 mL | INTRAVENOUS | Status: DC | PRN

## 2016-12-04 MED ORDER — VERAPAMIL HCL 2.5 MG/ML IV SOLN
INTRAVENOUS | Status: DC | PRN
  Administered 2016-12-04: 10 mL via INTRA_ARTERIAL

## 2016-12-04 MED ORDER — SODIUM CHLORIDE 0.9% FLUSH: 3.0000 mL | INTRAVENOUS | Status: DC | PRN

## 2016-12-04 MED ORDER — METOPROLOL TARTRATE 25 MG PO TABS: 25.0000 mg | ORAL_TABLET | Freq: Two times a day (BID) | ORAL | 6 refills | Status: DC

## 2016-12-04 SURGICAL SUPPLY — 10 items
CATH EXPO 5F FL3.5 (CATHETERS) ×2 IMPLANT
CATH EXPO 5FR FR4 (CATHETERS) ×2 IMPLANT
DEVICE RAD COMP TR BAND LRG (VASCULAR PRODUCTS) ×2 IMPLANT
GLIDESHEATH SLEND SS 6F .021 (SHEATH) ×2 IMPLANT
GUIDEWIRE INQWIRE 1.5J.035X260 (WIRE) ×1 IMPLANT
INQWIRE 1.5J .035X260CM (WIRE) ×2
KIT HEART LEFT (KITS) ×2 IMPLANT
PACK CARDIAC CATHETERIZATION (CUSTOM PROCEDURE TRAY) ×2 IMPLANT
TRANSDUCER W/STOPCOCK (MISCELLANEOUS) ×2 IMPLANT
TUBING CIL FLEX 10 FLL-RA (TUBING) ×2 IMPLANT

## 2016-12-04 NOTE — Care Management Note (Signed)
Case Management Note  Patient Details  Name: [name] MRN: [id number] Date of Birth: 11-07-1945  Subjective/Objective:   Chest pain, HTN, PVD             Action/Plan: Discharge Planning: NCM spoke to pt and lives at home alone. Son, [name] checks on him everyday. Pt was independent prior to hospital stay. Goes to [place]. Pt request NCM fax dc summary to his PCP.    PCP Avis Epley MD  Expected Discharge Date:                 Expected Discharge Plan:  Home/Self Care  In-House Referral:  NA  Discharge planning Services  CM Consult  Post Acute Care Choice:  NA Choice offered to:  NA  DME Arranged:  N/A DME Agency:  NA  HH Arranged:  NA HH Agency:  NA  Status of Service:  Completed, signed off  If discussed at Long Length of Stay Meetings, dates discussed:    Additional Comments:  Elliot Cousin, RN 12/04/2016, 10:39 AM

## 2016-12-04 NOTE — Care Management Obs Status (Signed)
MEDICARE OBSERVATION STATUS NOTIFICATION   Patient Details  Name: David Horne MRN: [id number] Date of Birth: [birth year]   Medicare Observation Status Notification Given:  Yes    Elliot Cousin, RN 12/04/2016, 11:28 AM

## 2016-12-04 NOTE — Discharge Instructions (Signed)
Call [facility] at [phone] if any bleeding, swelling or drainage at cath site.  May shower, no tub baths for 48 hours for groin sticks. No lifting over 5 pounds for 3 days.  No Driving for 3 days   You will have an Echo (ultrasound ) of your heart at our [facility] office on the 3rd floor.   See address.  Then follow up in office is at [facility] address.    On 12/07/16 you need to have labs at the [facility] office - you may go anytime after 0730 AM to the 3rd floor

## 2016-12-04 NOTE — Progress Notes (Signed)
The patient has been given his discharge paperwork along with follow up appointments and a new medication list with what to take today. The patient has been given education and instructions on care for his radial site. He is discharging with family via car.   Sheppard Evens RN

## 2016-12-04 NOTE — Interval H&P Note (Signed)
Cath Lab Visit (complete for each Cath Lab visit)  Clinical Evaluation Leading to the Procedure:   ACS: Yes.    Non-ACS:    Anginal Classification: CCS IV  Anti-ischemic medical therapy: Minimal Therapy (1 class of medications)  Non-Invasive Test Results: No non-invasive testing performed  Prior CABG: No previous CABG      History and Physical Interval Note:  12/04/2016 7:42 AM  David Horne  has presented today for surgery, with the diagnosis of angina @ rest  The various methods of treatment have been discussed with the patient and family. After consideration of risks, benefits and other options for treatment, the patient has consented to  Procedure(s): Left Heart Cath and Coronary Angiography (N/A) as a surgical intervention .  The patient's history has been reviewed, patient examined, no change in status, stable for surgery.  I have reviewed the patient's chart and labs.  Questions were answered to the patient's satisfaction.     Lance Muss

## 2016-12-04 NOTE — Progress Notes (Addendum)
Progress Note  Patient Name: David Horne Date of Encounter: 12/04/2016  Primary Cardiologist: Dr Jens Som  Subjective   Denies dyspnea or chest pain this AM  Inpatient Medications    Scheduled Meds: . amLODipine  10 mg Oral Daily  . aspirin  81 mg Oral Once  . [START ON 12/05/2016] aspirin EC  81 mg Oral Daily  . atorvastatin  80 mg Oral q1800  . losartan  100 mg Oral Daily  . metoprolol tartrate  25 mg Oral BID  . multivitamin with minerals  1 tablet Oral Daily  . omega-3 acid ethyl esters  2 g Oral BID  . pantoprazole  40 mg Oral Q0600  . sodium chloride flush  3 mL Intravenous Q12H  . sodium chloride flush  3 mL Intravenous Q12H  . ascorbic acid  1,000 mg Oral Daily   Continuous Infusions:  PRN Meds: sodium chloride, sodium chloride, acetaminophen, ALPRAZolam, ondansetron (ZOFRAN) IV, sodium chloride flush, sodium chloride flush, technetium TC 70M diethylenetriame-pentaacetic acid, zolpidem   Vital Signs    Vitals:   12/04/16 0805 12/04/16 0828 12/04/16 1014 12/04/16 1435  BP: 136/83 130/76 (!) 154/87 (!) 141/73  Pulse: 73 70 77 70  Resp: (!) 0 20  20  Temp:  97.9 F (36.6 C)  98.2 F (36.8 C)  TempSrc:  Oral  Oral  SpO2: 93% 96%  94%  Weight:      Height:        Intake/Output Summary (Last 24 hours) at 12/04/16 1521 Last data filed at 12/04/16 1124  Gross per 24 hour  Intake          1738.09 ml  Output              500 ml  Net          1238.09 ml   Filed Weights   12/03/16 0642 12/03/16 1420 12/04/16 0550  Weight: 184 lb (83.5 kg) 185 lb (83.9 kg) 184 lb 9.6 oz (83.7 kg)    Telemetry    Sinus with occasional PVC - Personally Reviewed   Physical Exam   GEN: No acute distress.   Neck: No JVD Cardiac: RRR, no murmurs, rubs, or gallops.  Respiratory: Clear to auscultation bilaterally. GI: Soft, nontender, non-distended  MS: No edema; No deformity. TR band in place radial cath site Neuro:  Nonfocal  Psych: Normal affect   Labs     Chemistry  Recent Labs Lab 12/03/16 0645 12/04/16 0636  NA 139 141  K 3.5 3.5  CL 107 109  CO2 24 22  GLUCOSE 116* 114*  BUN 13 8  CREATININE 1.44* 1.25*  CALCIUM 8.8* 8.5*  PROT  --  6.6  ALBUMIN  --  3.1*  AST  --  21  ALT  --  21  ALKPHOS  --  102  BILITOT  --  0.2*  GFRNONAA 47* 56*  GFRAA 55* >60  ANIONGAP 8 10     Hematology  Recent Labs Lab 12/03/16 0645  WBC 6.7  RBC 4.65  HGB 12.1*  HCT 38.1*  MCV 81.9  MCH 26.0  MCHC 31.8  RDW 15.3  PLT 243    Cardiac Enzymes  Recent Labs Lab 12/03/16 1947 12/04/16 0135 12/04/16 0636 12/04/16 1249  TROPONINI <0.03 <0.03 <0.03 <0.03     Recent Labs Lab 12/03/16 0651 12/03/16 1255  TROPIPOC 0.00 0.00     BNP  Recent Labs Lab 12/03/16 0645  BNP 35.2     DDimer  Recent Labs Lab 12/03/16 1250  DDIMER 1.44*     Radiology    Dg Chest 2 View  Result Date: 12/03/2016 CLINICAL DATA:  Left-sided chest pain. EXAM: CHEST  2 VIEW COMPARISON:  No prior . FINDINGS: Mediastinum hilar structures are normal. Mild cardiomegaly. No pulmonary venous congestion. Mild bilateral interstitial prominence. These changes may be chronic. Active interstitial process including pneumonitis or interstitial edema cannot be excluded. COPD. No pleural effusion or pneumothorax. No acute bony abnormality. IMPRESSION: 1. Mild bilateral interstitial prominence. Although these changes may be chronic an active interstitial process including pneumonitis or interstitial edema cannot be excluded. 2. COPD. 3. Mild cardiomegaly. Electronically Signed   By: Maisie Fus  Register   On: 12/03/2016 07:54   Nm Pulmonary Perf And Vent  Result Date: 12/03/2016 CLINICAL DATA:  Shortness of breath and chest pain EXAM: NUCLEAR MEDICINE VENTILATION - PERFUSION LUNG SCAN VIEWS: Anterior, posterior, right lateral, left lateral, RPO, LPO, RAO, LAO -ventilation and perfusion RADIOPHARMACEUTICALS:  32.0 mCi Technetium-7m DTPA aerosol inhalation and 4.1 mCi  Technetium-41m MAA IV COMPARISON:  Chest radiograph December 03, 2016 FINDINGS: Ventilation: There is decreased radiotracer uptake in portions of both upper lobes in a nonsegmental distribution. Ventilation elsewhere is unremarkable. Perfusion: There is decreased perfusion in the upper lobes in a nonsegmental distribution which matches the ventilation defects. There is no perfusion defect without corresponding ventilation defect. Chest radiograph shows bullous disease in the upper lobes. IMPRESSION: Matching ventilation and perfusion defects in areas of bullous disease seen by chest radiography. No appreciable ventilation/ perfusion mismatch. No well-defined segmental perfusion defect. These findings constitute an overall low probability of pulmonary embolus. Electronically Signed   By: Bretta Bang III M.D.   On: 12/03/2016 16:35    Patient Profile     71 year old male with past medical history of hypertension, hyperlipidemia, peripheral vascular disease, chronic stage III kidney disease with chest pain and dyspnea.   Assessment & Plan    1 dyspnea/chest pain-Etiology of symptoms unclear. D-dimer was mildly elevated but VQ scan low probability for pulmonary embolus and no risk factors. Cardiac catheterization reveals nonobstructive coronary disease and normal left ventricular end-diastolic pressure. Patient does have a history of tobacco use and COPD may be contributing. [facility] Echocardiogram can be performed as an outpatient. DC NTG.  2 peripheral vascular disease-continue aspirin and statin; will need FU lipids and liver in 4 weeks as lipitor added this admission.  3 hypertension-blood pressure elevated. Resume Cozaar 100 mg daily. Discontinue isosorbide as he does not have coronary disease.  4 chronic stage III kidney disease-patient will need follow-up laboratories on April 9 to check potassium and renal function.  If  radial cath site stable later this afternoon we'll discharge home and he will follow-up at the [facility] for his pulmonary function tests. We will arrange echocardiogram with our office. If his LV function is normal on echocardiogram and no valvular heart disease he will not need further cardiac FU.  > 30 min PA and physician time D2  Signed, Nada Boozer, NP  12/04/2016, 3:21 PM

## 2016-12-04 NOTE — Progress Notes (Signed)
ANTICOAGULATION CONSULT NOTE  Pharmacy Consult for heparin Indication: chest pain/ACS  No Known Allergies  Patient Measurements: Height:  (185.4 cm) Weight: 185 lb (83.9 kg) IBW/kg (Calculated) : 79.9 Heparin Dosing Weight: 84 Kg  Vital Signs: Temp: 98 F (36.7 C) (04/05 2015) Temp Source: Oral (04/05 2015) BP: 149/74 (04/05 2300) Pulse Rate: 72 (04/05 2015)  Labs:  Recent Labs  12/03/16 0645 12/03/16 1504 12/03/16 1947 12/04/16 0135  HGB 12.1*  --   --   --   HCT 38.1*  --   --   --   PLT 243  --   --   --   LABPROT  --   --  14.2  --   INR  --   --  1.10  --   HEPARINUNFRC  --  0.47  --  0.20*  CREATININE 1.44*  --   --   --   TROPONINI  --   --  <0.03  --    Estimated Creatinine Clearance: 53.2 mL/min (A) (by C-G formula based on SCr of 1.44 mg/dL (H)).  Assessment: 71 year old male on heparin for r/o ACS. Heparin level down to subtherapeutic (0.2) on gtt at 1000 units/hr. No issues with line or bleeding reported per RN.  Goal of Therapy:  Heparin level 0.3-0.7 units/ml Monitor platelets by anticoagulation protocol: Yes   Plan:  Increase heparin infusion to 1200 units/hr Will f/u 8 hr heparin level  Christoper Fabian, PharmD, BCPS Clinical pharmacist, pager [phone] 12/04/2016 2:51 AM

## 2016-12-04 NOTE — Discharge Summary (Signed)
Discharge Summary    Patient ID: David Horne,  MRN: [id number], DOB/AGE: 03-18-1946 71 y.o.  Admit date: 12/03/2016 Discharge date: 12/04/2016  Primary Care Provider: O'MEARA,THOMAS [facility]  [facility]charge Diagnoses    Principal Problem:   Anginal chest pain at rest [facility]) Active Problems:   HTN, goal below 130/80   CKD (chronic kidney disease), stage III   Dyslipidemia   PAD (peripheral artery disease) (HCC)   Unstable angina (HCC)   Angina at rest [facility])   Allergies No Known Allergies  Diagnostic Studies/Procedures    12/04/16  Cardiac cath by Dr. Eldridge Dace Procedures   Left Heart Cath and Coronary Angiography  Conclusion     Mid RCA lesion, 25 %stenosed.  Prox RCA lesion, 10 %stenosed.  Mid LAD lesion, 10 %stenosed.  Ost 2nd Diag to 2nd Diag lesion, 20 %stenosed.  Mid Cx lesion, 10 %stenosed.  The left ventricular systolic function is normal.  LV end diastolic pressure is normal.  The left ventricular ejection fraction is 55-65% by visual estimate.  There is no aortic valve stenosis.  Radial approach used despite borderline Allens test given prior fem/fem bypass and redo.   No significant CAD.  Normal LVEDP.  No cardiac cause for anginal symptoms identified.  Further plans per inpatient team.     ECHO as outpt.   _____________   History of Present Illness     71 y.o. male with a history of PAD w/ L-R fem-fem and redo 2016, HTN, GERD. Had a stress test prior to re-do fem-fem, ok per pt.   He went to [facility] on 11/19/2016 because he wakened by SOB. He was seen at the [facility] 11/30/2016, and they are scheduling a breathing test and a stress test.  He normally is very active but now he has increased DOE.  He has to stop every 5 min.  He presented to [facility] 12/03/16 after waking from sleep with chest pain, sharp and steady with Lt arm numbness and associated with SOB, diaphoresis and nausea. He took ASA and came to ER.  He  was given more asa and placed on IV NTG and IV heparin.  Admitted with plans for cath due to acute episode and continued arm pain.     Hospital Course     Consultants: none  Pt  Had all neg troponins <0.03 and BNP of 35.  He was gently hydrated and cath planned for 12/04/16.  He did have an elevated D-dimer at 1.44 and underwent VQ scan that was low probablity for PE.    Pt underwent cardiac cath without problems.  He was found to have non obstructive disease and normal LVEDP.  He was seen and found stable for discharge by Dr. Jens Som.     Per Dr. Jens Som "1. dyspnea/chest pain-Etiology of symptoms unclear. D-dimer was mildly elevated but VQ scan low probability for pulmonary embolus and no risk factors. Cardiac catheterization reveals nonobstructive coronary disease and normal left ventricular end-diastolic pressure. Patient does have a history of tobacco use and COPD may be contributing. [facility]. Echocardiogram can be performed as an outpatient. DC NTG.  2 peripheral vascular disease-continue aspirin and statin; will need FU lipids and liver in 4 weeks as lipitor added this admission.  3 hypertension-blood pressure elevated. Resume Cozaar 100 mg daily. Discontinue isosorbide as he does not have coronary disease.  4 chronic stage III kidney disease-patient will  need follow-up laboratories on April 9 to check potassium and renal function."  Pt was ready for d/c, he has ambulated without problems.  Cath site was stable.  _____________  Discharge Vitals Blood pressure (!) 154/87, pulse 77, temperature 97.9 F (36.6 C), temperature source Oral, resp. rate 20, height  (1.854 m), weight 184 lb 9.6 oz (83.7 kg), SpO2 96 %.  Filed Weights   12/03/16 0642 12/03/16 1420 12/04/16 0550  Weight: 184 lb (83.5 kg) 185 lb (83.9 kg) 184 lb 9.6 oz (83.7 kg)    Labs & Radiologic Studies    CBC  Recent Labs  12/03/16 0645   WBC 6.7  HGB 12.1*  HCT 38.1*  MCV 81.9  PLT 243   Basic Metabolic Panel  Recent Labs  12/03/16 0645 12/04/16 0636  NA 139 141  K 3.5 3.5  CL 107 109  CO2 24 22  GLUCOSE 116* 114*  BUN 13 8  CREATININE 1.44* 1.25*  CALCIUM 8.8* 8.5*   Liver Function Tests  Recent Labs  12/04/16 0636  AST 21  ALT 21  ALKPHOS 102  BILITOT 0.2*  PROT 6.6  ALBUMIN 3.1*   No results for input(s): LIPASE, AMYLASE in the last 72 hours. Cardiac Enzymes  Recent Labs  12/03/16 1947 12/04/16 0135 12/04/16 0636  TROPONINI <0.03 <0.03 <0.03   BNP Invalid input(s): POCBNP D-Dimer  Recent Labs  12/03/16 1250  DDIMER 1.44*   Hemoglobin A1C  Recent Labs  12/03/16 1947  HGBA1C 5.8*   Fasting Lipid Panel  Recent Labs  12/04/16 0135  CHOL 185  HDL 36*  LDLCALC 115*  TRIG 172*  CHOLHDL 5.1   Thyroid Function Tests No results for input(s): TSH, T4TOTAL, T3FREE, THYROIDAB in the last 72 hours.  Invalid input(s): FREET3 _____________  Dg Chest 2 View  Result Date: 12/03/2016 CLINICAL DATA:  Left-sided chest pain. EXAM: CHEST  2 VIEW COMPARISON:  No prior . FINDINGS: Mediastinum hilar structures are normal. Mild cardiomegaly. No pulmonary venous congestion. Mild bilateral interstitial prominence. These changes may be chronic. Active interstitial process including pneumonitis or interstitial edema cannot be excluded. COPD. No pleural effusion or pneumothorax. No acute bony abnormality. IMPRESSION: 1. Mild bilateral interstitial prominence. Although these changes may be chronic an active interstitial process including pneumonitis or interstitial edema cannot be excluded. 2. COPD. 3. Mild cardiomegaly. Electronically Signed   By: Maisie Fus  Register   On: 12/03/2016 07:54   Nm Pulmonary Perf And Vent  Result Date: 12/03/2016 CLINICAL DATA:  Shortness of breath and chest pain EXAM: NUCLEAR MEDICINE VENTILATION - PERFUSION LUNG SCAN VIEWS: Anterior, posterior, right lateral, left  lateral, RPO, LPO, RAO, LAO -ventilation and perfusion RADIOPHARMACEUTICALS:  32.0 mCi Technetium-57m DTPA aerosol inhalation and 4.1 mCi Technetium-66m MAA IV COMPARISON:  Chest radiograph December 03, 2016 FINDINGS: Ventilation: There is decreased radiotracer uptake in portions of both upper lobes in a nonsegmental distribution. Ventilation elsewhere is unremarkable. Perfusion: There is decreased perfusion in the upper lobes in a nonsegmental distribution which matches the ventilation defects. There is no perfusion defect without corresponding ventilation defect. Chest radiograph shows bullous disease in the upper lobes. IMPRESSION: Matching ventilation and perfusion defects in areas of bullous disease seen by chest radiography. No appreciable ventilation/ perfusion mismatch. No well-defined segmental perfusion defect. These findings constitute an overall low probability of pulmonary embolus. Electronically Signed   By: Bretta Bang III M.D.   On: 12/03/2016 16:35   Disposition   Pt is being discharged home today in good  condition.  Follow-up Plans & Appointments    Follow-up Information    [facility] Office Follow up on 12/17/2016.   Specialty:  Cardiology Why:   at 3:00 pm Contact information: [address] [place] [address] [phone]       Olga Millers, MD Follow up on 01/11/2017.   Specialty:  Cardiology Why:  with his  PA Randall An, at [employer] information: [address] [place] [address] [phone]           Call [facility] at [phone] if any bleeding, swelling or drainage at cath site.  May shower, no tub baths for 48 hours for groin sticks. No lifting over 5 pounds for 3 days.  No Driving for 3 days   You will have an Echo (ultrasound ) of your heart at our [facility] office on the 3rd floor.   See address.  Then follow up in office is at [facility] address.     On 12/07/16 you need to have labs at the [facility] office - you may go anytime after 0730 AM to the 3rd floor     Discharge Medications   Current Discharge Medication List    CONTINUE these medications which have NOT CHANGED   Details  amLODipine (NORVASC) 10 MG tablet Take 10 mg by mouth daily.    ascorbic acid (VITAMIN C) 1000 MG tablet Take 1,000 mg by mouth daily.    aspirin 81 MG tablet Take 81 mg by mouth daily.    isosorbide mononitrate (IMDUR) 60 MG 24 hr tablet Take 60 mg by mouth daily.    losartan (COZAAR) 100 MG tablet Take 100 mg by mouth daily.     Multiple Vitamins-Minerals (CENTRUM SILVER PO) Take 1 tablet by mouth daily.     Omega-3 Fatty Acids (FISH OIL) 1000 MG CAPS Take 2,000 mg by mouth 2 (two) times daily.     omeprazole (PRILOSEC) 20 MG capsule Take 20 mg by mouth daily.    clopidogrel (PLAVIX) 75 MG tablet TAKE 1 TABLET BY MOUTH DAILY Qty: 30 tablet, Refills: 6    oxyCODONE-acetaminophen (PERCOCET/ROXICET) 5-325 MG per tablet Take 1-2 tablets by mouth every 4 (four) hours as needed for moderate pain. Qty: 30 tablet, Refills: 0         Outstanding Labs/Studies   Needs hepatic and lipids ordered on OV.   Duration of Discharge Encounter   Greater than 30 minutes including physician time.  Signed, Nada Boozer NP 12/04/2016, 11:21 AM

## 2016-12-04 NOTE — Care Management CC44 (Signed)
Condition Code 44 Documentation Completed  Patient Details  Name: Keionte Swicegood MRN: [id number] Date of Birth: Jul 22, 1946   Condition Code 44 given:  Yes Patient signature on Condition Code 44 notice:  Yes Documentation of 2 MD's agreement:  Yes Code 44 added to claim:  Yes    Elliot Cousin, RN 12/04/2016, 11:28 AM

## 2016-12-07 ENCOUNTER — Telehealth: Payer: Self-pay | Admitting: *Deleted

## 2016-12-07 ENCOUNTER — Other Ambulatory Visit: Payer: Medicare Other | Admitting: *Deleted

## 2016-12-07 DIAGNOSIS — R0789 Other chest pain: Secondary | ICD-10-CM

## 2016-12-07 DIAGNOSIS — Z79899 Other long term (current) drug therapy: Secondary | ICD-10-CM

## 2016-12-07 DIAGNOSIS — N183 Chronic kidney disease, stage 3 unspecified: Secondary | ICD-10-CM

## 2016-12-07 LAB — BASIC METABOLIC PANEL
BUN / CREAT RATIO: 11 (ref 10–24)
BUN: 15 mg/dL (ref 8–27)
CHLORIDE: 102 mmol/L (ref 96–106)
CO2: 23 mmol/L (ref 18–29)
Calcium: 9.2 mg/dL (ref 8.6–10.2)
Creatinine, Ser: 1.37 mg/dL — ABNORMAL HIGH (ref 0.76–1.27)
GFR calc Af Amer: 60 mL/min/{1.73_m2} (ref >59)
GFR calc non Af Amer: 52 mL/min/{1.73_m2} — ABNORMAL LOW (ref >59)
GLUCOSE: 88 mg/dL (ref 65–99)
Potassium: 4.2 mmol/L (ref 3.5–5.2)
SODIUM: 141 mmol/L (ref 134–144)

## 2016-12-07 NOTE — Telephone Encounter (Signed)
-----   Message from Leone Brand, NP sent at 12/07/2016  3:57 PM EDT ----- Labs stable recheck in 2 weeks. Thanks.

## 2016-12-17 ENCOUNTER — Ambulatory Visit (HOSPITAL_COMMUNITY): Payer: Medicare Other | Attending: Cardiology

## 2016-12-17 ENCOUNTER — Other Ambulatory Visit: Payer: Self-pay

## 2016-12-17 ENCOUNTER — Other Ambulatory Visit: Payer: Medicare Other | Admitting: *Deleted

## 2016-12-17 DIAGNOSIS — I129 Hypertensive chronic kidney disease with stage 1 through stage 4 chronic kidney disease, or unspecified chronic kidney disease: Secondary | ICD-10-CM | POA: Insufficient documentation

## 2016-12-17 DIAGNOSIS — E785 Hyperlipidemia, unspecified: Secondary | ICD-10-CM | POA: Diagnosis not present

## 2016-12-17 DIAGNOSIS — N189 Chronic kidney disease, unspecified: Secondary | ICD-10-CM | POA: Insufficient documentation

## 2016-12-17 DIAGNOSIS — Z79899 Other long term (current) drug therapy: Secondary | ICD-10-CM

## 2016-12-17 DIAGNOSIS — I340 Nonrheumatic mitral (valve) insufficiency: Secondary | ICD-10-CM | POA: Insufficient documentation

## 2016-12-17 DIAGNOSIS — I071 Rheumatic tricuspid insufficiency: Secondary | ICD-10-CM | POA: Diagnosis not present

## 2016-12-17 DIAGNOSIS — R0789 Other chest pain: Secondary | ICD-10-CM | POA: Diagnosis not present

## 2016-12-17 LAB — ECHOCARDIOGRAM COMPLETE
CHL CUP MV DEC (S): 264
E decel time: 264 msec
EERAT: 7.52
FS: 33 % (ref 28–44)
IVS/LV PW RATIO, ED: 0.91
LA diam end sys: 28 mm
LA diam index: 1.35 cm/m2
LASIZE: 28 mm
LAVOL: 62 mL
LAVOLA4C: 50 mL
LAVOLIN: 29.8 mL/m2
LVEEAVG: 7.52
LVEEMED: 7.52
LVELAT: 12.8 cm/s
LVOT area: 3.8 cm2
LVOT diameter: 22 mm
Lateral S' vel: 15 cm/s
MV pk E vel: 96.3 m/s
MVPG: 4 mmHg
MVPKAVEL: 94.3 m/s
PW: 11 mm — AB (ref 0.6–1.1)
TDI e' lateral: 12.8
TDI e' medial: 8.66

## 2016-12-18 LAB — BASIC METABOLIC PANEL
BUN/Creatinine Ratio: 14 (ref 10–24)
BUN: 21 mg/dL (ref 8–27)
CALCIUM: 8.7 mg/dL (ref 8.6–10.2)
CHLORIDE: 106 mmol/L (ref 96–106)
CO2: 21 mmol/L (ref 18–29)
Creatinine, Ser: 1.46 mg/dL — ABNORMAL HIGH (ref 0.76–1.27)
GFR calc Af Amer: 55 mL/min/{1.73_m2} — ABNORMAL LOW (ref >59)
GFR, EST NON AFRICAN AMERICAN: 48 mL/min/{1.73_m2} — AB (ref >59)
Glucose: 101 mg/dL — ABNORMAL HIGH (ref 65–99)
POTASSIUM: 3.7 mmol/L (ref 3.5–5.2)
Sodium: 142 mmol/L (ref 134–144)

## 2017-01-08 NOTE — Progress Notes (Signed)
Cardiology Office Note    Date:  01/11/2017   ID:  David Horne, DOB Nov 19, 1945, MRN [id number]  PCP:  Avis Epley, MD  Cardiologist: Dr. Jens Som - Will follow-up with [facility]  Chief Complaint  Patient presents with  . Hospitalization Follow-up    History of Present Illness:    David Horne is a 71 y.o. male with past medical history of HTN, GERD, Stage 3 CKD, and PAD (s/p L-R fem-fem bypass with redo in 2016) who presents to the office today for hospital follow-up.   He was recently admitted from 4/5 - 12/04/2016 for evaluation of chest pain. Reported having worsening dyspnea on exertion for the past several weeks but on the day of presentation, he developed a sharp pain along his left pectoral region which awoke him from sleep and radiated down his left arm. Cyclic troponin values were negative and EKG showed no acute ischemic changes. D-dimer was elevated but a VQ scan was low-risk. A cardiac catheterization was performed and showed non-obstructive disease. An outpatient echocardiogram was obtained and showed a preserved EF of 60-65% with Grade 1 DD and no regional WMA. Trivial TR was noted. Was informed to follow-up with Cardiology on an as-needed basis.   In talking with the patient today, he reports doing well since his recent hospitalization. He denies any recurrent episodes of chest discomfort. Has continued to experience episodes of dyspnea on exertion when carrying out outside yard work but no dyspnea when walking around his home or the store. [facility]curently awaiting results).   He was told by his PCP to stop Atorvastatin and Plavix and reports Dr. Arbie Cookey is aware of this. He was placed on Lopressor 25mg  BID at the time of his hospitalization but says he stopped this in there interim due to it "aggrivating his ulcer". Says he had noticed hematochezia with this and upon stopping Lopressor, his symptoms resolved within 2 days. He  denies any recurrent melena or hematochezia since self-discontinuing the medication.   Reports a history of white-coat hypertension but follows his BP closely at home and says his SBP is usually in the 120's - 130's. He had went through ambulatory BP monitoring twice in the past by his reports and says BP was within a normal range.    Past Medical History:  Diagnosis Date  . Anginal chest pain at rest [facility]) 12/03/2016   a. 11/2016: cath showing minimal CAD with normal EF.   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 Atherosclerosis of native artery of right lower extremity with intermittent claudication (HCC)   . CKD (chronic kidney disease), stage III   . Dyslipidemia 02/06/2013  . Esophageal reflux   . History of stomach ulcers    "took RX and it went away"  . Hypertension   . PAD (peripheral artery disease) (HCC)     Past Surgical History:  Procedure Laterality Date  . ABDOMINAL AORTAGRAM N/A 08/06/2014   Procedure: ABDOMINAL Ronny Flurry;  Surgeon: Chuck Hint, MD;  Location[facility] C[facility] LAB;  Service: Cardiovascular;  Laterality: N/A;  . COLONOSCOPY W/ BIOPSIES AND POLYPECTOMY    . EXCISIONAL HEMORRHOIDECTOMY    . FEMORAL ARTERY - FEMORAL ARTERY BYPASS GRAFT  2005   Left to Right   . FEMORAL-FEMORAL BYPASS GRAFT Bilateral 09/10/2014   Procedure: REDO BYPASS GRAFT LEFT FEMORAL-RIGHT FEMORAL ARTERY;  Surgeon: Larina Earthly, MD;  Location: [facility] OR;  Service: Vascular;  Laterality: Bilateral;  . LEFT HEART CATH AND CORONARY ANGIOGRAPHY N/A 12/04/2016   Procedure: Left Heart Cath and Coronary Angiography;  Surgeon: Corky Crafts, MD;  Location: [facility] INVASIVE CV LAB;  Service: Cardiovascular;  Laterality: N/A;    Current Medications: Outpatient Medications Prior to Visit  Medication Sig Dispense Refill  . amLODipine (NORVASC) 10 MG tablet Take 10 mg by mouth daily.    Marland Kitchen ascorbic acid (VITAMIN C) 1000 MG tablet Take 1,000 mg by mouth daily.    Marland Kitchen aspirin 81  MG tablet Take 81 mg by mouth daily.    Marland Kitchen losartan (COZAAR) 100 MG tablet Take 100 mg by mouth daily.     . Multiple Vitamins-Minerals (CENTRUM SILVER PO) Take 1 tablet by mouth daily.     . Omega-3 Fatty Acids (FISH OIL) 1000 MG CAPS Take 2,000 mg by mouth 2 (two) times daily.     Marland Kitchen omeprazole (PRILOSEC) 20 MG capsule Take 20 mg by mouth daily.    Marland Kitchen acetaminophen (TYLENOL) 325 MG tablet Take 2 tablets (650 mg total) by mouth every 4 (four) hours as needed for headache or mild pain. (Patient not taking: Reported on 01/11/2017)    . atorvastatin (LIPITOR) 80 MG tablet Take 1 tablet (80 mg total) by mouth daily at 6 PM. 30 tablet 6  . clopidogrel (PLAVIX) 75 MG tablet TAKE 1 TABLET BY MOUTH DAILY (Patient not taking: Reported on 12/03/2016) 30 tablet 6  . metoprolol tartrate (LOPRESSOR) 25 MG tablet Take 1 tablet (25 mg total) by mouth 2 (two) times daily. 60 tablet 6  . oxyCODONE-acetaminophen (PERCOCET/ROXICET) 5-325 MG per tablet Take 1-2 tablets by mouth every 4 (four) hours as needed for moderate pain. (Patient not taking: Reported on 08/04/2016) 30 tablet 0   No facility-administered medications prior to visit.      Allergies:   Patient has no known allergies.   Social History   Social History  . Marital status: Widowed    Spouse name: N/A  . Number of children: N/A  . Years of education: N/A   Occupational History  . Retired Naval architect    Social History Main Topics  . Smoking status: Former Smoker    Packs/day: 1.00    Years: 43.00    Types: Cigarettes    Quit date: 2002  . Smokeless tobacco: Never Used  . Alcohol use 0.0 oz/week     Comment: 12/03/2016 "stopped 04/27/1977"  . Drug use: No  . Sexual activity: Not Asked   Other Topics Concern  . None   Social History Narrative   Pt lives with 4 dogs. Good family support nearby.     Family History:  The patient's family history includes CVA in his mother; Deep vein thrombosis in his sister; Heart disease in his mother.    Review of Systems:   Please see the history of present illness.     General:  No chills, fever, night sweats or weight changes.  Cardiovascular:  No chest pain, edema, orthopnea, palpitations, paroxysmal nocturnal dyspnea. Positive for dyspnea on exertion.  Dermatological: No rash, lesions/masses Respiratory: No cough, dyspnea Urologic: No hematuria, dysuria Abdominal:   No nausea, vomiting, diarrhea, bright red blood per rectum, melena, or hematemesis Neurologic:  No visual changes, wkns, changes in mental status. All other systems reviewed and are otherwise negative except as noted above.   Physical Exam:    VS:  BP (!) 184/100   Pulse 98   Ht 6\' 1"  (1.854 m)  Wt 188 lb 6.4 oz (85.5 kg)   BMI 24.86 kg/m    General: Well developed, well nourished Caucasian male appearing in no acute distress. Head: Normocephalic, atraumatic, sclera non-icteric, no xanthomas, nares are without discharge.  Neck: No carotid bruits. JVD not elevated.  Lungs: Respirations regular and unlabored, without wheezes or rales.  Heart: Regular rate and rhythm. No S3 or S4.  No murmur, no rubs, or gallops appreciated. Abdomen: Soft, non-tender, non-distended with normoactive bowel sounds. No hepatomegaly. No rebound/guarding. No obvious abdominal masses. Msk:  Strength and tone appear normal for age. No joint deformities or effusions. Extremities: No clubbing or cyanosis. No lower extremity edema.  Distal pedal pulses are 2+ bilaterally. Radial cath site with no ecchymosis or evidence of a hematoma.  Neuro: Alert and oriented X 3. Moves all extremities spontaneously. No focal deficits noted. Psych:  Responds to questions appropriately with a normal affect. Skin: No rashes or lesions noted  Wt Readings from Last 3 Encounters:  01/11/17 188 lb 6.4 oz (85.5 kg)  12/04/16 184 lb 9.6 oz (83.7 kg)  08/04/16 184 lb (83.5 kg)    Studies/Labs Reviewed:   EKG:  EKG is ordered today.  The ekg ordered today  demonstrates NSR, HR 98, with PVC's and no acute ST or T-wave changes when compared to prior tracings.   Recent Labs: 12/03/2016: B Natriuretic Peptide 35.2; Hemoglobin 12.1; Platelets 243 12/04/2016: ALT 21 12/17/2016: BUN 21; Creatinine, Ser 1.46; Potassium 3.7; Sodium 142   Lipid Panel    Component Value Date/Time   CHOL 185 12/04/2016 0135   TRIG 172 (H) 12/04/2016 0135   HDL 36 (L) 12/04/2016 0135   CHOLHDL 5.1 12/04/2016 0135   VLDL 34 12/04/2016 0135   LDLCALC 115 (H) 12/04/2016 0135    Additional studies/ records that were reviewed today include:   Cardiac Catheterization: 12/04/2016  Mid RCA lesion, 25 %stenosed.  Prox RCA lesion, 10 %stenosed.  Mid LAD lesion, 10 %stenosed.  Ost 2nd Diag to 2nd Diag lesion, 20 %stenosed.  Mid Cx lesion, 10 %stenosed.  The left ventricular systolic function is normal.  LV end diastolic pressure is normal.  The left ventricular ejection fraction is 55-65% by visual estimate.  There is no aortic valve stenosis.  Radial approach used despite borderline Allens test given prior fem/fem bypass and redo.   No significant CAD.  Normal LVEDP.  No cardiac cause for anginal symptoms identified.  Further plans per inpatient team.   Echocardiogram: 12/17/2016 Study Conclusions  - Left ventricle: The cavity size was normal. Systolic function was   normal. The estimated ejection fraction was in the range of 60%   to 65%. Wall motion was normal; there were no regional wall   motion abnormalities. Doppler parameters are consistent with   abnormal left ventricular relaxation (grade 1 diastolic   dysfunction). - Tricuspid valve: There was trivial regurgitation.  Assessment:    1. Dyspnea on exertion   2. Accelerated hypertension   3. PVD (peripheral vascular disease) (HCC)   4. Dyslipidemia      Plan:   In order of problems listed above:  1. Dyspnea on Exertion - recently admitted for evaluation of chest pain and dyspnea on  exertion. D-dimer was elevated but a VQ scan was low-risk. Cardiac cath showed non-obstructive disease. Echo with a preserved EF of 60-65% with Grade 1 DD, no regional WMA, and no significant valve abnormalities.  - he denies any recurrent chest pain. Still having dyspnea on exertion when performing  yard-work. No symptoms with walking around his home or the grocery store.  - [facility]. I recommended he keep close follow-up with his PCP as a referral to Pulmonology may be necessary pending his results as he is concerned due to possible Agent Orange exposure and tobacco use in the past. [facility] and will see Cardiology there on an as-needed basis.   2. Accelerated HTN - BP at 182/100 on initial check, at 174/92 on recheck. Patient reports a history of white-coat hypertension but follows his BP closely at home and says his SBP is usually in the 120's - 130's.  - prior ambulatory BP readings have been within normal limits per patient report. - continue Amlodipine 10mg  daily and Losartan 100mg  daily. Stopped Lopressor due to associated hematochezia. I told them this was a very unlikely side-effect of the medication but he wishes to avoid resuming it.   3. PVD - s/p L-R fem-fem bypass with redo in 2016. - continue ASA. Recently discontinued Plavix and statin. - followed by Vascular Surgery.   4. Dyslipidemia - Lipid Panel during recent admission showed total cholesterol 185, HDL 36, and LDL 115. - was on Lipitor 80mg  daily but says this was discontinued by his PCP. I recommended he follow-up with his PCP in regards to this with his known PVD.    Medication Adjustments/Labs and Tests Ordered: Current medicines are reviewed at length with the patient today.  Concerns regarding medicines are outlined above.  Medication changes, Labs and Tests ordered today are listed in the Patient Instructions below. Patient Instructions  Your  physician recommends that you schedule a follow-up appointment in: As Needed   Signed, Ellsworth LennoxBrittany M Desaray Marschner, PA-C  01/11/2017 4:02 PM    [facility] [address] [place], [place]  [address] Phone: [phone]; Fax: [phone]  [address] [place], [place] [address] Phone: [phone]

## 2017-01-11 ENCOUNTER — Ambulatory Visit (INDEPENDENT_AMBULATORY_CARE_PROVIDER_SITE_OTHER): Payer: Medicare Other | Admitting: Student

## 2017-01-11 ENCOUNTER — Encounter: Payer: Self-pay | Admitting: Student

## 2017-01-11 VITALS — BP 184/100 | HR 98 | Ht 73.0 in | Wt 188.4 lb

## 2017-01-11 DIAGNOSIS — I10 Essential (primary) hypertension: Secondary | ICD-10-CM

## 2017-01-11 DIAGNOSIS — I739 Peripheral vascular disease, unspecified: Secondary | ICD-10-CM | POA: Diagnosis not present

## 2017-01-11 DIAGNOSIS — E785 Hyperlipidemia, unspecified: Secondary | ICD-10-CM

## 2017-01-11 DIAGNOSIS — R0609 Other forms of dyspnea: Secondary | ICD-10-CM | POA: Diagnosis not present

## 2017-01-11 NOTE — Patient Instructions (Signed)
Your physician recommends that you schedule a follow-up appointment in: As Needed    

## 2017-02-25 ENCOUNTER — Encounter (HOSPITAL_COMMUNITY): Payer: Self-pay | Admitting: *Deleted

## 2017-02-25 ENCOUNTER — Inpatient Hospital Stay (HOSPITAL_COMMUNITY)
Admission: AD | Admit: 2017-02-25 | Discharge: 2017-03-03 | DRG: 417 | Disposition: A | Discharge status: Home / Self Care | Payer: Medicare Other | Source: Other Acute Inpatient Hospital | Attending: Internal Medicine | Admitting: Internal Medicine

## 2017-02-25 DIAGNOSIS — N179 Acute kidney failure, unspecified: Secondary | ICD-10-CM | POA: Diagnosis present

## 2017-02-25 DIAGNOSIS — K649 Unspecified hemorrhoids: Secondary | ICD-10-CM | POA: Diagnosis present

## 2017-02-25 DIAGNOSIS — R0602 Shortness of breath: Secondary | ICD-10-CM

## 2017-02-25 DIAGNOSIS — K810 Acute cholecystitis: Secondary | ICD-10-CM | POA: Diagnosis not present

## 2017-02-25 DIAGNOSIS — Z7982 Long term (current) use of aspirin: Secondary | ICD-10-CM

## 2017-02-25 DIAGNOSIS — J9811 Atelectasis: Secondary | ICD-10-CM | POA: Diagnosis not present

## 2017-02-25 DIAGNOSIS — K8021 Calculus of gallbladder without cholecystitis with obstruction: Secondary | ICD-10-CM

## 2017-02-25 DIAGNOSIS — R0902 Hypoxemia: Secondary | ICD-10-CM

## 2017-02-25 DIAGNOSIS — I2510 Atherosclerotic heart disease of native coronary artery without angina pectoris: Secondary | ICD-10-CM | POA: Diagnosis present

## 2017-02-25 DIAGNOSIS — K219 Gastro-esophageal reflux disease without esophagitis: Secondary | ICD-10-CM | POA: Diagnosis present

## 2017-02-25 DIAGNOSIS — E785 Hyperlipidemia, unspecified: Secondary | ICD-10-CM | POA: Diagnosis present

## 2017-02-25 DIAGNOSIS — Z87891 Personal history of nicotine dependence: Secondary | ICD-10-CM

## 2017-02-25 DIAGNOSIS — D62 Acute posthemorrhagic anemia: Secondary | ICD-10-CM | POA: Diagnosis not present

## 2017-02-25 DIAGNOSIS — J9601 Acute respiratory failure with hypoxia: Secondary | ICD-10-CM | POA: Diagnosis not present

## 2017-02-25 DIAGNOSIS — K8001 Calculus of gallbladder with acute cholecystitis with obstruction: Principal | ICD-10-CM | POA: Diagnosis present

## 2017-02-25 DIAGNOSIS — N183 Chronic kidney disease, stage 3 unspecified: Secondary | ICD-10-CM | POA: Diagnosis present

## 2017-02-25 DIAGNOSIS — I10 Essential (primary) hypertension: Secondary | ICD-10-CM | POA: Diagnosis not present

## 2017-02-25 DIAGNOSIS — K8042 Calculus of bile duct with acute cholecystitis without obstruction: Secondary | ICD-10-CM | POA: Diagnosis present

## 2017-02-25 DIAGNOSIS — E876 Hypokalemia: Secondary | ICD-10-CM | POA: Diagnosis present

## 2017-02-25 DIAGNOSIS — R000 Tachycardia, unspecified: Secondary | ICD-10-CM

## 2017-02-25 DIAGNOSIS — I129 Hypertensive chronic kidney disease with stage 1 through stage 4 chronic kidney disease, or unspecified chronic kidney disease: Secondary | ICD-10-CM | POA: Diagnosis present

## 2017-02-25 DIAGNOSIS — D631 Anemia in chronic kidney disease: Secondary | ICD-10-CM | POA: Diagnosis present

## 2017-02-25 DIAGNOSIS — I739 Peripheral vascular disease, unspecified: Secondary | ICD-10-CM | POA: Diagnosis present

## 2017-02-25 DIAGNOSIS — Z79899 Other long term (current) drug therapy: Secondary | ICD-10-CM

## 2017-02-25 MED ORDER — LOSARTAN POTASSIUM 50 MG PO TABS: 100.0000 mg | ORAL_TABLET | Freq: Every day | ORAL | Status: DC

## 2017-02-25 MED ORDER — HYDROMORPHONE HCL 1 MG/ML IJ SOLN
0.5000 mg | INTRAMUSCULAR | Status: DC | PRN
  Administered 2017-02-25 – 2017-02-27 (×8): 0.5 mg via INTRAVENOUS
  Filled 2017-02-25 (×8): qty 0.5

## 2017-02-25 MED ORDER — ENOXAPARIN SODIUM 40 MG/0.4ML ~~LOC~~ SOLN
40.0000 mg | SUBCUTANEOUS | Status: DC
  Administered 2017-02-25: 40 mg via SUBCUTANEOUS
  Filled 2017-02-25: qty 0.4

## 2017-02-25 MED ORDER — PANTOPRAZOLE SODIUM 40 MG PO TBEC
40.0000 mg | DELAYED_RELEASE_TABLET | Freq: Every day | ORAL | Status: DC
  Administered 2017-02-27 – 2017-03-03 (×5): 40 mg via ORAL
  Filled 2017-02-25 (×5): qty 1

## 2017-02-25 MED ORDER — ONDANSETRON HCL 4 MG PO TABS: 4.0000 mg | ORAL_TABLET | Freq: Four times a day (QID) | ORAL | Status: DC | PRN

## 2017-02-25 MED ORDER — ACETAMINOPHEN 650 MG RE SUPP: 650.0000 mg | Freq: Four times a day (QID) | RECTAL | Status: DC | PRN

## 2017-02-25 MED ORDER — SODIUM CHLORIDE 0.9 % IV SOLN
INTRAVENOUS | Status: DC
  Administered 2017-02-25 – 2017-02-26 (×2): via INTRAVENOUS

## 2017-02-25 MED ORDER — ACETAMINOPHEN 325 MG PO TABS: 650.0000 mg | ORAL_TABLET | Freq: Four times a day (QID) | ORAL | Status: DC | PRN

## 2017-02-25 MED ORDER — HYDROCODONE-ACETAMINOPHEN 5-325 MG PO TABS
1.0000 | ORAL_TABLET | ORAL | Status: DC | PRN
  Administered 2017-02-27 – 2017-03-03 (×15): 2 via ORAL
  Filled 2017-02-25 (×15): qty 2

## 2017-02-25 MED ORDER — AMLODIPINE BESYLATE 10 MG PO TABS
10.0000 mg | ORAL_TABLET | Freq: Every day | ORAL | Status: DC
  Administered 2017-02-27 – 2017-03-03 (×5): 10 mg via ORAL
  Filled 2017-02-25 (×5): qty 1

## 2017-02-25 MED ORDER — ONDANSETRON HCL 4 MG/2ML IJ SOLN
4.0000 mg | Freq: Four times a day (QID) | INTRAMUSCULAR | Status: DC | PRN
  Administered 2017-02-25: 4 mg via INTRAVENOUS
  Filled 2017-02-25: qty 2

## 2017-02-25 NOTE — H&P (Signed)
History and Physical    David Horne OVZ:[id number] DOB: Sep 28, 1945 DOA: 02/25/2017  PCP: Garner Gavel, MD   Patient coming from: [facility]  Chief Complaint: RUQ paini  HPI: David Horne is a 71 y.o. gentleman with history of HTN, HLD, PAD s/p fem-fem bypass surgery x 2, CKD 3, nonobstructing CAD, and GERD who presented to the ED at an OSH for evaluation of new onset RUQ pain that awakened him out of sleep around 5AM.  The pain has been described as a constant dull ache with intermittent sharp pains that are 8 out of 10 in intensity.  He has has nausea and vomiting, nonbloody emesis, at least eight episodes reported today.  No fever, chills, or sweats (except with vomiting).  No light-headedness or LOC.  No diarrhea.  Symptoms were not helped by Maalox (which he threw up).  ED Course: Data from the outside hospital reviewed.  WBC count there was 12.  Alk phos as 146 but the rest of his CMP was unremarkable.  The patient was afebrile with a mild sinus tachycardia.  RUQ ultrasound showed a 2.5 cm obstructing stone in the gallbladder neck with positive sonographic Murphy's sign.  Patient accepted to the hospitalist service; surgery to consult upon arrival.  Review of Systems: As per HPI otherwise 10 point review of systems negative.    Past Medical History:  Diagnosis Date  . Anginal chest pain at rest [facility]) 12/03/2016   a. 11/2016: cath showing minimal CAD with normal EF.   Marland Kitchen Atherosclerosis of bypass graft of right lower extremity ([place])   . Atherosclerosis of native artery of right lower extremity with intermittent claudication ([place])   . CKD (chronic kidney disease), stage III   . Dyslipidemia 02/06/2013  . Esophageal reflux   . History of stomach ulcers    "took RX and it went away"  . Hypertension   . PAD (peripheral artery disease) ([place])     Past Surgical History:  Procedure Laterality Date  . ABDOMINAL AORTAGRAM N/A 08/06/2014   Procedure: ABDOMINAL Maxcine Ham;  Surgeon: Angelia Mould, MD;  Location: [facility] CATH LAB;  Service: Cardiovascular;  Laterality: N/A;  . COLONOSCOPY W/ BIOPSIES AND POLYPECTOMY    . EXCISIONAL HEMORRHOIDECTOMY    . FEMORAL ARTERY - FEMORAL ARTERY BYPASS GRAFT  2005   Left to Right   . FEMORAL-FEMORAL BYPASS GRAFT Bilateral 09/10/2014   Procedure: REDO BYPASS GRAFT LEFT FEMORAL-RIGHT FEMORAL ARTERY;  Surgeon: Rosetta Posner, MD;  Location: [facility];  Service: Vascular;  Laterality: Bilateral;  . LEFT HEART CATH AND CORONARY ANGIOGRAPHY N/A 12/04/2016   Procedure: Left Heart Cath and Coronary Angiography;  Surgeon: Jettie Booze, MD;  Location: [facility] CV LAB;  Service: Cardiovascular;  Laterality: N/A;     reports that he quit smoking about 16 years ago. His smoking use included Cigarettes. He has a 43.00 pack-year smoking history. He has never used smokeless tobacco. He reports that he drinks alcohol. He reports that he does not use drugs. He is a widower.  He identifies his daughter as his next of kin.  No Known Allergies  Family History  Problem Relation Age of Onset  . Deep vein thrombosis Sister        Blood Clots  . CVA Mother   . Heart disease Mother      Prior to Admission medications   Medication Sig Start Date End Date Taking? Authorizing Provider  amLODipine (NORVASC) 10 MG tablet Take 10 mg by mouth daily.  [provider]  ascorbic acid (VITAMIN C) 1000 MG tablet Take 1,000 mg by mouth daily.    [provider]  aspirin 81 MG tablet Take 81 mg by mouth daily.    [provider]  losartan (COZAAR) 100 MG tablet Take 100 mg by mouth daily.     [provider]  Multiple Vitamins-Minerals (CENTRUM SILVER PO) Take 1 tablet by mouth daily.     [provider]  Omega-3 Fatty Acids (FISH OIL) 1000 MG CAPS Take 2,000 mg by mouth 2 (two) times daily.     [provider]  omeprazole (PRILOSEC) 20 MG capsule Take 20 mg by mouth daily.    [provider]    Physical  Exam: Vitals:   02/25/17 1815  BP: (!) 153/83  Pulse: (!) 109  Resp: 18  Temp: 98 F (36.7 C)  TempSrc: Oral  Weight: 84.4 kg (186 lb)  Height: 6' 1"  (1.854 m)      Constitutional: NAD, calm, NONtoxic appearing but complaining of RUQ pain 8 out of 10 Vitals:   02/25/17 1815  BP: (!) 153/83  Pulse: (!) 109  Resp: 18  Temp: 98 F (36.7 C)  TempSrc: Oral  Weight: 84.4 kg (186 lb)  Height: 6' 1"  (1.854 m)   Eyes: PERRL, lids and conjunctivae normal ENMT: Mucous membranes are dry. Posterior pharynx clear of any exudate or lesions. Normal dentition.  Neck: normal appearance, supple, no masses Respiratory: clear to auscultation bilaterally, no wheezing, no crackles. Normal respiratory effort. No accessory muscle use.  Cardiovascular: Normal rate, regular rhythm, no murmurs / rubs / gallops. No extremity edema. Diminished pedal pulses. GI: abdomen is mildly distended with RUQ tenderness and guarding.  Bowel sounds are present. Musculoskeletal:  No joint deformity in upper and lower extremities. Good ROM, no contractures. Normal muscle tone.  Skin: no rashes, warm and dry Neurologic: No apparent focal deficits. Psychiatric: Normal judgment and insight. Alert and oriented x 3. Normal mood.    Labs on Admission: Pertinent outside labs and imaging reviewed and noted above.   EKG: Independently reviewed. Sinus tachycardia.  No acute ST segment changes.  Assessment/Plan Principal Problem:   Acute cholecystitis Active Problems:   PVD (peripheral vascular disease) (HCC)   HTN, goal below 130/80   CKD (chronic kidney disease), stage III      Acute cholecystitis with obstructing stone in gallbladder neck on U/S --General Surgery consult appreciated --NPO for now --Analgesics and antiemetics as needed --NS at 100cc/hr --Empiric zosyn --Blood cultures for fever  HTN --Amlodipine, losartan  GERD --PPI  PAD --Hold baby aspirin in anticipation of surgery   DVT  prophylaxis: Lov[place]ox Code Status: FULL Family Communication: Patient alone at time of admission. Disposition Plan: Expect he will go home at discharge. Consults called: General Surgery ([place]) Admission status: Inpatient, med surg.  I expect this patient will need inpatient services for greater than two midnights.   TIME SPENT: 50 minutes   Eber Jones MD [facility] Pager [phone]  If 7PM-7AM, please contact night-coverage [email or web address] Password [facility]  02/25/2017, 7:09 PM

## 2017-02-25 NOTE — Consult Note (Signed)
General Surgery [facility]  Reason for Consult: abdominal pain, cholecystitis, cholelithiasis  Referring Physician: Dr. Michael Litter, [facility]  David Horne is an 71 y.o. male.  HPI: patient is a 71 yo WM with sudden onset RUQ abd pain this AM.  This was followed by nausea and emesis.  Pain became more severe and patient presented to [facility] for evaluation.  USN demonstrated a large gallstone impacted in the neck of the gallbladder.  Patient requested transfer to [facility] where he has been a vascular surgery patient in the past.  He was directed to [facility] by Dr. Harden Mo.  No history of jaundice or acholic stools.  No prior abdominal surgery.  No hx of hepatitis or pancreatitis.  Denies fever or chills.  Daughter at bedside.  Past Medical History:  Diagnosis Date  . Anginal chest pain at rest [facility]) 12/03/2016   a. 11/2016: cath showing minimal CAD with normal EF.   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 Atherosclerosis of native artery of right lower extremity with intermittent claudication (HCC)   . CKD (chronic kidney disease), stage III   . Dyslipidemia 02/06/2013  . Esophageal reflux   . History of stomach ulcers    "took RX and it went away"  . Hypertension   . PAD (peripheral artery disease) (HCC)     Past Surgical History:  Procedure Laterality Date  . ABDOMINAL AORTAGRAM N/A 08/06/2014   Procedure: ABDOMINAL Ronny Flurry;  Surgeon: Chuck Hint, MD;  [facility] LAB;  Service: Cardiovascular;  Laterality: N/A;  . COLONOSCOPY W/ BIOPSIES AND POLYPECTOMY    . EXCISIONAL HEMORRHOIDECTOMY    . FEMORAL ARTERY - FEMORAL ARTERY BYPASS GRAFT  2005   Left to Right   . FEMORAL-FEMORAL BYPASS GRAFT Bilateral 09/10/2014   Procedure: REDO BYPASS GRAFT LEFT FEMORAL-RIGHT FEMORAL ARTERY;  Surgeon: Larina Earthly, MD;  Location[facility];  Service: Vascular;  Laterality: Bilateral;  . LEFT  HEART CATH AND CORONARY ANGIOGRAPHY N/A 12/04/2016   Procedure: Left Heart Cath and Coronary Angiography;  Surgeon: Corky Crafts, MD;  Location: [facility] INVASIVE CV LAB;  Service: Cardiovascular;  Laterality: N/A;    Family History  Problem Relation Age of Onset  . Deep vein thrombosis Sister        Blood Clots  . CVA Mother   . Heart disease Mother     Social History:  reports that he quit smoking about 16 years ago. His smoking use included Cigarettes. He has a 43.00 pack-year smoking history. He has never used smokeless tobacco. He reports that he drinks alcohol. He reports that he does not use drugs.  Allergies: No Known Allergies  Medications: I have reviewed the patient's current medications.  No results found for this or any previous visit (from the past 48 hour(s)).  No results found.  Review of Systems  Constitutional: Negative for chills, diaphoresis and fever.  HENT: Negative.   Eyes: Negative.   Respiratory: Negative.   Cardiovascular: Negative.   Gastrointestinal: Positive for abdominal pain (RUQ), nausea and vomiting. Negative for constipation and diarrhea.  Genitourinary: Negative.   Musculoskeletal: Negative.   Skin: Negative.   Neurological: Negative.   Endo/Heme/Allergies: Negative.   Psychiatric/Behavioral: Negative.    Blood pressure (!) 153/83, pulse (!) 109, temperature 98 F (36.7 C), temperature source Oral, resp. rate 18, height 6\' 1"  (1.854 m), weight 84.4 kg (186 lb). Physical Exam  Constitutional: He  is oriented to person, place, and time. He appears well-developed and well-nourished. No distress.  HENT:  Head: Normocephalic and atraumatic.  Right Ear: External ear normal.  Left Ear: External ear normal.  Eyes: Conjunctivae are normal. Pupils are equal, round, and reactive to light. No scleral icterus.  Neck: Normal range of motion. Neck supple. No tracheal deviation present. No thyromegaly present.  Cardiovascular: Normal rate, regular rhythm  and normal heart sounds.   No murmur heard. Respiratory: Effort normal and breath sounds normal. No respiratory distress. He has no wheezes.  GI: Soft. He exhibits distension (mild). He exhibits no mass. There is tenderness (mild to moderate RUQ). There is guarding (voluntary). There is no rebound.  Musculoskeletal: Normal range of motion. He exhibits no edema or deformity.  Neurological: He is alert and oriented to person, place, and time.  Skin: Skin is warm and dry. He is not diaphoretic.  Psychiatric: He has a normal mood and affect. His behavior is normal.    Assessment/Plan: Cholecystitis, cholelithiasis, biliary colic  Admit to medical service for evaluation and clearance for surgery  NPO after MN tonight for probable OR on Friday, 6/29, by Dr. Avel Peaceodd Rosenbower  Discussed laparoscopic cholecystectomy with patient and daughter at bedside.  Explained use of general anesthesia.  Discussed hospital stay and post op recovery.  Discussed possible need for conversion to open surgery.  They understand and wish to proceed with surgery as soon as possible.  Velora Hecklerodd M. Andriel Omalley, MD, [facility] Office: [phone]    David Horne Judie PetitM 02/25/2017, 8:41 PM

## 2017-02-25 NOTE — Progress Notes (Signed)
[facility] David Horne is a 71 year old male pmh HTN, HLD, PAD s/p bypass grafting, and CKD; who presented with nausea and vomiting. Imaging studies revealed a 2-1/2 cm obstructing gallstone in the bladder neck of the gallbladder. Vital signs otherwise noted to be stable at this time. Gastroenterology was consulted and will see the patient upon arrival. Admitted as inpatient to a MedSurg bed.

## 2017-02-26 ENCOUNTER — Encounter (HOSPITAL_COMMUNITY)
Admission: AD | Disposition: A | Discharge status: Home / Self Care | Payer: Self-pay | Source: Other Acute Inpatient Hospital | Attending: Family Medicine

## 2017-02-26 ENCOUNTER — Inpatient Hospital Stay (HOSPITAL_COMMUNITY): Payer: Medicare Other | Admitting: Anesthesiology

## 2017-02-26 ENCOUNTER — Encounter (HOSPITAL_COMMUNITY): Payer: Self-pay | Admitting: Certified Registered Nurse Anesthetist

## 2017-02-26 HISTORY — PX: CHOLECYSTECTOMY: SHX55

## 2017-02-26 LAB — CBC WITH DIFFERENTIAL/PLATELET
Basophils Absolute: 0 10*3/uL (ref 0.0–0.1)
Basophils Relative: 0 %
EOS ABS: 0 10*3/uL (ref 0.0–0.7)
Eosinophils Relative: 0 %
HCT: 33.5 % — ABNORMAL LOW (ref 39.0–52.0)
Hemoglobin: 10.6 g/dL — ABNORMAL LOW (ref 13.0–17.0)
LYMPHS ABS: 0.8 10*3/uL (ref 0.7–4.0)
Lymphocytes Relative: 5 %
MCH: 24 pg — AB (ref 26.0–34.0)
MCHC: 31.6 g/dL (ref 30.0–36.0)
MCV: 76 fL — AB (ref 78.0–100.0)
MONO ABS: 1.3 10*3/uL — AB (ref 0.1–1.0)
Monocytes Relative: 8 %
NEUTROS ABS: 14.7 10*3/uL — AB (ref 1.7–7.7)
Neutrophils Relative %: 87 %
PLATELETS: 368 10*3/uL (ref 150–400)
RBC: 4.41 MIL/uL (ref 4.22–5.81)
RDW: 14.8 % (ref 11.5–15.5)
WBC: 16.8 10*3/uL — ABNORMAL HIGH (ref 4.0–10.5)

## 2017-02-26 LAB — PROTIME-INR
INR: 1.13
PROTHROMBIN TIME: 14.5 s (ref 11.4–15.2)

## 2017-02-26 LAB — COMPREHENSIVE METABOLIC PANEL
ALT: 22 U/L (ref 17–63)
ANION GAP: 9 (ref 5–15)
AST: 26 U/L (ref 15–41)
Albumin: 3.8 g/dL (ref 3.5–5.0)
Alkaline Phosphatase: 101 U/L (ref 38–126)
BUN: 21 mg/dL — ABNORMAL HIGH (ref 6–20)
CHLORIDE: 107 mmol/L (ref 101–111)
CO2: 22 mmol/L (ref 22–32)
CREATININE: 1.49 mg/dL — AB (ref 0.61–1.24)
Calcium: 8.7 mg/dL — ABNORMAL LOW (ref 8.9–10.3)
GFR, EST AFRICAN AMERICAN: 53 mL/min — AB (ref >60)
GFR, EST NON AFRICAN AMERICAN: 45 mL/min — AB (ref >60)
Glucose, Bld: 135 mg/dL — ABNORMAL HIGH (ref 65–99)
POTASSIUM: 4.5 mmol/L (ref 3.5–5.1)
Sodium: 138 mmol/L (ref 135–145)
Total Bilirubin: 0.9 mg/dL (ref 0.3–1.2)
Total Protein: 7.3 g/dL (ref 6.5–8.1)

## 2017-02-26 LAB — APTT: aPTT: 31 seconds (ref 24–36)

## 2017-02-26 LAB — MRSA PCR SCREENING: MRSA BY PCR: NEGATIVE

## 2017-02-26 SURGERY — LAPAROSCOPIC CHOLECYSTECTOMY WITH INTRAOPERATIVE CHOLANGIOGRAM
Anesthesia: General | Site: Abdomen

## 2017-02-26 MED ORDER — BUPIVACAINE-EPINEPHRINE 0.25% -1:200000 IJ SOLN
INTRAMUSCULAR | Status: DC | PRN
  Administered 2017-02-26: 16 mL

## 2017-02-26 MED ORDER — SODIUM CHLORIDE 0.9 % IR SOLN
Status: DC | PRN
  Administered 2017-02-26: 1000 mL

## 2017-02-26 MED ORDER — FENTANYL CITRATE (PF) 250 MCG/5ML IJ SOLN
INTRAMUSCULAR | Status: AC
  Filled 2017-02-26: qty 5

## 2017-02-26 MED ORDER — ROCURONIUM BROMIDE 10 MG/ML (PF) SYRINGE
PREFILLED_SYRINGE | INTRAVENOUS | Status: DC | PRN
  Administered 2017-02-26: 40 mg via INTRAVENOUS

## 2017-02-26 MED ORDER — BUPIVACAINE-EPINEPHRINE (PF) 0.25% -1:200000 IJ SOLN
INTRAMUSCULAR | Status: AC
  Filled 2017-02-26: qty 30

## 2017-02-26 MED ORDER — LIDOCAINE 2% (20 MG/ML) 5 ML SYRINGE
INTRAMUSCULAR | Status: DC | PRN
  Administered 2017-02-26: 60 mg via INTRAVENOUS

## 2017-02-26 MED ORDER — FENTANYL CITRATE (PF) 100 MCG/2ML IJ SOLN
INTRAMUSCULAR | Status: DC | PRN
  Administered 2017-02-26: 50 ug via INTRAVENOUS
  Administered 2017-02-26: 100 ug via INTRAVENOUS
  Administered 2017-02-26 (×2): 50 ug via INTRAVENOUS

## 2017-02-26 MED ORDER — FENTANYL CITRATE (PF) 100 MCG/2ML IJ SOLN
INTRAMUSCULAR | Status: AC
  Administered 2017-02-26: 25 ug via INTRAVENOUS
  Filled 2017-02-26: qty 2

## 2017-02-26 MED ORDER — PIPERACILLIN-TAZOBACTAM 3.375 G IVPB
3.3750 g | Freq: Three times a day (TID) | INTRAVENOUS | Status: DC
Start: 2017-02-26 — End: 2017-02-26
  Administered 2017-02-26: 3.375 g via INTRAVENOUS
  Filled 2017-02-26 (×3): qty 50

## 2017-02-26 MED ORDER — HYDROCODONE-ACETAMINOPHEN 5-325 MG PO TABS
1.0000 | ORAL_TABLET | Freq: Once | ORAL | Status: AC
  Administered 2017-02-26: 1 via ORAL

## 2017-02-26 MED ORDER — FENTANYL CITRATE (PF) 100 MCG/2ML IJ SOLN: 25.0000 ug | INTRAMUSCULAR | Status: DC | PRN

## 2017-02-26 MED ORDER — LACTATED RINGERS IV SOLN
INTRAVENOUS | Status: DC
  Administered 2017-02-26: 13:00:00 via INTRAVENOUS

## 2017-02-26 MED ORDER — SUCCINYLCHOLINE CHLORIDE 200 MG/10ML IV SOSY
PREFILLED_SYRINGE | INTRAVENOUS | Status: AC
  Filled 2017-02-26: qty 10

## 2017-02-26 MED ORDER — SUGAMMADEX SODIUM 200 MG/2ML IV SOLN
INTRAVENOUS | Status: AC
  Filled 2017-02-26: qty 2

## 2017-02-26 MED ORDER — PHENYLEPHRINE 40 MCG/ML (10ML) SYRINGE FOR IV PUSH (FOR BLOOD PRESSURE SUPPORT)
PREFILLED_SYRINGE | INTRAVENOUS | Status: AC
  Filled 2017-02-26: qty 10

## 2017-02-26 MED ORDER — PROPOFOL 10 MG/ML IV BOLUS
INTRAVENOUS | Status: AC
  Filled 2017-02-26: qty 20

## 2017-02-26 MED ORDER — ENOXAPARIN SODIUM 40 MG/0.4ML ~~LOC~~ SOLN
40.0000 mg | SUBCUTANEOUS | Status: DC
  Administered 2017-02-28 – 2017-03-02 (×3): 40 mg via SUBCUTANEOUS
  Filled 2017-02-26 (×3): qty 0.4

## 2017-02-26 MED ORDER — PROPOFOL 10 MG/ML IV BOLUS
INTRAVENOUS | Status: DC | PRN
  Administered 2017-02-26: 170 mg via INTRAVENOUS

## 2017-02-26 MED ORDER — PIPERACILLIN-TAZOBACTAM 3.375 G IVPB
3.3750 g | Freq: Three times a day (TID) | INTRAVENOUS | Status: AC
  Administered 2017-02-27 – 2017-03-02 (×12): 3.375 g via INTRAVENOUS
  Filled 2017-02-26 (×12): qty 50

## 2017-02-26 MED ORDER — PHENYLEPHRINE 40 MCG/ML (10ML) SYRINGE FOR IV PUSH (FOR BLOOD PRESSURE SUPPORT)
PREFILLED_SYRINGE | INTRAVENOUS | Status: DC | PRN
  Administered 2017-02-26: 80 ug via INTRAVENOUS

## 2017-02-26 MED ORDER — PIPERACILLIN-TAZOBACTAM 3.375 G IVPB
3.3750 g | Freq: Once | INTRAVENOUS | Status: AC
  Administered 2017-02-26: 3.375 g via INTRAVENOUS
  Filled 2017-02-26: qty 50

## 2017-02-26 MED ORDER — FENTANYL CITRATE (PF) 100 MCG/2ML IJ SOLN
25.0000 ug | INTRAMUSCULAR | Status: DC | PRN
  Administered 2017-02-26 (×6): 25 ug via INTRAVENOUS

## 2017-02-26 MED ORDER — LIDOCAINE 2% (20 MG/ML) 5 ML SYRINGE
INTRAMUSCULAR | Status: AC
  Filled 2017-02-26: qty 5

## 2017-02-26 MED ORDER — PIPERACILLIN-TAZOBACTAM 3.375 G IVPB 30 MIN
3.3750 g | Freq: Once | INTRAVENOUS | Status: AC
  Administered 2017-02-26: 3.375 g via INTRAVENOUS
  Filled 2017-02-26: qty 50

## 2017-02-26 MED ORDER — ONDANSETRON HCL 4 MG/2ML IJ SOLN
INTRAMUSCULAR | Status: DC | PRN
  Administered 2017-02-26: 4 mg via INTRAVENOUS

## 2017-02-26 MED ORDER — LACTATED RINGERS IV SOLN
INTRAVENOUS | Status: DC | PRN
  Administered 2017-02-26 (×2): via INTRAVENOUS

## 2017-02-26 MED ORDER — METOPROLOL TARTRATE 5 MG/5ML IV SOLN: 2.5000 mg | INTRAVENOUS | Status: DC | PRN

## 2017-02-26 MED ORDER — IOPAMIDOL (ISOVUE-300) INJECTION 61%
INTRAVENOUS | Status: AC
  Filled 2017-02-26: qty 50

## 2017-02-26 MED ORDER — HEMOSTATIC AGENTS (NO CHARGE) OPTIME
TOPICAL | Status: DC | PRN
  Administered 2017-02-26: 1 via TOPICAL

## 2017-02-26 MED ORDER — SUCCINYLCHOLINE CHLORIDE 200 MG/10ML IV SOSY
PREFILLED_SYRINGE | INTRAVENOUS | Status: DC | PRN
  Administered 2017-02-26: 120 mg via INTRAVENOUS

## 2017-02-26 MED ORDER — SODIUM CHLORIDE 0.9 % IV SOLN
INTRAVENOUS | Status: DC
  Administered 2017-02-26: 20:00:00 via INTRAVENOUS

## 2017-02-26 MED ORDER — ONDANSETRON HCL 4 MG/2ML IJ SOLN
INTRAMUSCULAR | Status: AC
  Filled 2017-02-26: qty 2

## 2017-02-26 MED ORDER — SUGAMMADEX SODIUM 200 MG/2ML IV SOLN
INTRAVENOUS | Status: DC | PRN
  Administered 2017-02-26: 170 mg via INTRAVENOUS

## 2017-02-26 MED ORDER — DEXTROSE-NACL 5-0.45 % IV SOLN: INTRAVENOUS | Status: DC

## 2017-02-26 MED ORDER — 0.9 % SODIUM CHLORIDE (POUR BTL) OPTIME
TOPICAL | Status: DC | PRN
  Administered 2017-02-26: 1000 mL

## 2017-02-26 MED ORDER — ROCURONIUM BROMIDE 10 MG/ML (PF) SYRINGE
PREFILLED_SYRINGE | INTRAVENOUS | Status: AC
  Filled 2017-02-26: qty 5

## 2017-02-26 SURGICAL SUPPLY — 47 items
APPLIER CLIP 5 13 M/L LIGAMAX5 (MISCELLANEOUS) ×3
BIOPATCH RED 1 DISK 7.0 (GAUZE/BANDAGES/DRESSINGS) ×2 IMPLANT
BIOPATCH RED 1IN DISK 7.0MM (GAUZE/BANDAGES/DRESSINGS) ×1
BLADE CLIPPER SURG (BLADE) ×3 IMPLANT
CANISTER SUCT 3000ML PPV (MISCELLANEOUS) ×3 IMPLANT
CHLORAPREP W/TINT 26ML (MISCELLANEOUS) ×3 IMPLANT
CLIP APPLIE 5 13 M/L LIGAMAX5 (MISCELLANEOUS) ×1 IMPLANT
CLOSURE WOUND 1/2 X4 (GAUZE/BANDAGES/DRESSINGS) ×1
COVER MAYO STAND STRL (DRAPES) ×3 IMPLANT
COVER SURGICAL LIGHT HANDLE (MISCELLANEOUS) ×3 IMPLANT
DERMABOND ADVANCED (GAUZE/BANDAGES/DRESSINGS) ×2
DERMABOND ADVANCED .7 DNX12 (GAUZE/BANDAGES/DRESSINGS) ×1 IMPLANT
DEVICE TROCAR PUNCTURE CLOSURE (ENDOMECHANICALS) ×3 IMPLANT
DRAIN CHANNEL 19F RND (DRAIN) ×3 IMPLANT
DRAPE C-ARM 42X72 X-RAY (DRAPES) ×3 IMPLANT
DRSG TEGADERM 4X4.75 (GAUZE/BANDAGES/DRESSINGS) ×3 IMPLANT
ELECT REM PT RETURN 9FT ADLT (ELECTROSURGICAL) ×3
ELECTRODE REM PT RTRN 9FT ADLT (ELECTROSURGICAL) ×1 IMPLANT
EVACUATOR SILICONE 100CC (DRAIN) ×3 IMPLANT
GLOVE BIO SURGEON STRL SZ7 (GLOVE) ×3 IMPLANT
GLOVE BIOGEL PI IND STRL 6 (GLOVE) ×1 IMPLANT
GLOVE BIOGEL PI IND STRL 7.5 (GLOVE) ×1 IMPLANT
GLOVE BIOGEL PI INDICATOR 6 (GLOVE) ×2
GLOVE BIOGEL PI INDICATOR 7.5 (GLOVE) ×2
GOWN STRL REUS W/ TWL LRG LVL3 (GOWN DISPOSABLE) ×3 IMPLANT
GOWN STRL REUS W/TWL LRG LVL3 (GOWN DISPOSABLE) ×6
HEMOSTAT SNOW SURGICEL 2X4 (HEMOSTASIS) ×3 IMPLANT
KIT BASIN OR (CUSTOM PROCEDURE TRAY) ×3 IMPLANT
KIT ROOM TURNOVER OR (KITS) ×3 IMPLANT
NS IRRIG 1000ML POUR BTL (IV SOLUTION) ×3 IMPLANT
PAD ARMBOARD 7.5X6 YLW CONV (MISCELLANEOUS) ×3 IMPLANT
POUCH RETRIEVAL ECOSAC 10 (ENDOMECHANICALS) ×1 IMPLANT
POUCH RETRIEVAL ECOSAC 10MM (ENDOMECHANICALS) ×2
SCISSORS LAP 5X35 DISP (ENDOMECHANICALS) ×3 IMPLANT
SET CHOLANGIOGRAPH 5 50 .035 (SET/KITS/TRAYS/PACK) ×3 IMPLANT
SET IRRIG TUBING LAPAROSCOPIC (IRRIGATION / IRRIGATOR) ×3 IMPLANT
SLEEVE ENDOPATH XCEL 5M (ENDOMECHANICALS) ×6 IMPLANT
SPECIMEN JAR SMALL (MISCELLANEOUS) ×3 IMPLANT
STRIP CLOSURE SKIN 1/2X4 (GAUZE/BANDAGES/DRESSINGS) ×2 IMPLANT
SUT ETHILON 2 0 FS 18 (SUTURE) ×3 IMPLANT
SUT MNCRL AB 4-0 PS2 18 (SUTURE) ×3 IMPLANT
SUT VICRYL 0 UR6 27IN ABS (SUTURE) ×3 IMPLANT
TOWEL OR 17X24 6PK STRL BLUE (TOWEL DISPOSABLE) ×3 IMPLANT
TRAY LAPAROSCOPIC MC (CUSTOM PROCEDURE TRAY) ×3 IMPLANT
TROCAR XCEL BLUNT TIP 100MML (ENDOMECHANICALS) ×3 IMPLANT
TROCAR XCEL NON-BLD 5MMX100MML (ENDOMECHANICALS) ×3 IMPLANT
TUBING INSUFFLATION (TUBING) ×3 IMPLANT

## 2017-02-26 NOTE — Progress Notes (Signed)
Patient ID: David KaufmanClyde Gruhn, male   DOB: 01/20/1946, 71 y.o.   MRN: [id number] I have reviewed data and examined patient. He has acute calculous cholecystitis by exam and by us.  lfts are normal. I discussed proceeding with lap chole today. I discussed the procedure in detail.   We discussed the risks and benefits of a laparoscopic cholecystectomy including, but not limited to bleeding, infection, injury to surrounding structures such as the intestine or liver, bile leak, retained gallstones, need to convert to an open procedure, prolonged diarrhea, blood clots such as  DVT, common bile duct injury, anesthesia risks, and possible need for additional procedures.

## 2017-02-26 NOTE — Op Note (Signed)
Preoperative diagnosis:gangrenous cholecystitis Postoperative diagnosis: saa Procedure: Laparoscopic cholecystectomy Surgeon: Dr. Harden MoMatt Yarelie Horne Anesthesia: Gen. Specimens: gb to pathology Estimated blood loss: 20 cc Complications: None Drains: 19 Fr Blake drain to gb fossa Sponge count was correct at completion Disposition to recovery stable  Indications: This is a [age] yom with cholecystitis.  I discussed proceeding to the or for lap chole.   Procedure: After informed consent was obtained the patient was taken to the operating room. He was given antibiotics. SCDs were in place. He was placed undergeneral anesthesia without complication. His abdomen was prepped and draped in the standard sterile surgical fashion. A surgical timeout was then performed.  I then infiltrated Marcaine below the umbilicus. I made a vertical incision. I identified the fascia incised sharply. I entered into the peritoneum bluntly. There is no evidence of an entry injury. I then placed a 0 Vicryl pursestring suture. I then inserted a Hassan trocar and insufflated to 15 mmHg pressure. I then placed a 5 mm trocar in the epigastrium.Two5 mm trocars were placed in the right side of the abdomen. I grasped the gallbladder and retracted cephalad.The gallbladder was noted to be distended and appeared to have some gangrenous areas. I aspirated the gallbladder and then was able to grasp it.I was able to identify the critical view of safety. I identified the cystic artery. I divided this with two clips remaining in place for both the anterior and posterior branches separately. I proceeded to address the cystic duct. I placed three clips and divided this. The cystic duct was viable and the clips completely traversed the duct. O did enter the gallbladder some more and it was necrotic. There was purulence filling the gallballder. I then removed the gallbladder from the liver bed. I placed it in a bag and removed from the  umbilicus. I obtained hemostasis. I placed apiece of Surgicel in the liver bed overlying this. I placed a 19 Fr Blake drain in the gallbladder fossa and brought this out the left lower quadrant and secured it with a 2-0 nylon.  I then [facility] the [facility] and tied the pursestring down. I did place 2 additional 2-0 Vicryl sutures at this area using the endoclose device. I then removed the remaining trocars and these were closed with 4-0 Monocryl and glue. He tolerated this well be transferred recovery room.

## 2017-02-26 NOTE — Anesthesia Procedure Notes (Signed)
Procedure Name: Intubation Date/Time: 02/26/2017 1:33 PM Performed by: Rise PatienceBELL, Tarris Delbene T Pre-anesthesia Checklist: Patient identified, Emergency Drugs available, Suction available and Patient being monitored Patient Re-evaluated:Patient Re-evaluated prior to inductionOxygen Delivery Method: Circle System Utilized Preoxygenation: Pre-oxygenation with 100% oxygen Intubation Type: IV induction, Rapid sequence and Cricoid Pressure applied Laryngoscope Size: Miller and 2 Grade View: Grade I Tube type: Oral Tube size: 7.5 mm Number of attempts: 1 Airway Equipment and Method: Stylet and Oral airway Placement Confirmation: ETT inserted through vocal cords under direct vision,  positive ETCO2 and breath sounds checked- equal and bilateral Secured at: 22 cm Tube secured with: Tape Dental Injury: Teeth and Oropharynx as per pre-operative assessment

## 2017-02-26 NOTE — Interval H&P Note (Signed)
History and Physical Interval Note:  02/26/2017 12:56 PM  David Horne  has presented today for surgery, with the diagnosis of cholecystitis  The various methods of treatment have been discussed with the patient and family. After consideration of risks, benefits and other options for treatment, the patient has consented to  Procedure(s): LAPAROSCOPIC CHOLECYSTECTOMY WITH POSSIBLE INTRAOPERATIVE CHOLANGIOGRAM (N/A) as a surgical intervention .  The patient's history has been reviewed, patient examined, no change in status, stable for surgery.  I have reviewed the patient's chart and labs.  Questions were answered to the patient's satisfaction.     Karesha Trzcinski

## 2017-02-26 NOTE — Progress Notes (Signed)
[facility] Progress Note     Subjective: CC: RUQ pain Patient with pain in RUQ, relieved with pain medication. Remains NPO. Discussed that we may not be able to get case done today. If we can't take him to the OR today, it will be done tomorrow. Patient voiced understanding and agreement.   Objective: Vital signs in last 24 hours: Temp:  [98 F (36.7 C)-98.2 F (36.8 C)] 98.1 F (36.7 C) (06/29 0441) Pulse Rate:  [108-110] 108 (06/29 0441) Resp:  [18-20] 20 (06/29 0441) BP: (129-153)/(69-83) 129/69 (06/29 0441) SpO2:  [85 %-95 %] 95 % (06/29 0441) Weight:  [84.4 kg (186 lb)] 84.4 kg (186 lb) (06/28 1815) Last BM Date: 02/25/17  Intake/Output from previous day: 06/28 0701 - 06/29 0700 In: -  Out: 250 [Urine:250] Intake/Output this shift: No intake/output data recorded.  PE: Gen:  Alert, NAD, pleasant Card:  Regular rate and rhythm, pedal pulses 2+ BL Pulm:  Normal effort, clear to auscultation bilaterally Abd: Soft, TTP in RUQ, non-distended, bowel sounds present in all 4 quadrants, no HSM.  Skin: warm and dry, no rashes  Psych: A&Ox3   Lab Results:   Recent Labs  02/26/17 0343  WBC 16.8*  HGB 10.6*  HCT 33.5*  PLT 368   BMET  Recent Labs  02/26/17 0343  NA 138  K 4.5  CL 107  CO2 22  GLUCOSE 135*  BUN 21*  CREATININE 1.49*  CALCIUM 8.7*   PT/INR  Recent Labs  02/26/17 0343  LABPROT 14.5  INR 1.13   CMP     Component Value Date/Time   NA 138 02/26/2017 0343   NA 142 12/17/2016 1352   K 4.5 02/26/2017 0343   CL 107 02/26/2017 0343   CO2 22 02/26/2017 0343   GLUCOSE 135 (H) 02/26/2017 0343   BUN 21 (H) 02/26/2017 0343   BUN 21 12/17/2016 1352   CREATININE 1.49 (H) 02/26/2017 0343   CALCIUM 8.7 (L) 02/26/2017 0343   PROT 7.3 02/26/2017 0343   ALBUMIN 3.8 02/26/2017 0343   AST 26 02/26/2017 0343   ALT 22 02/26/2017 0343   ALKPHOS 101 02/26/2017 0343   BILITOT 0.9 02/26/2017 0343   GFRNONAA 45 (L) 02/26/2017 0343   GFRAA  53 (L) 02/26/2017 0343    Anti-infectives: Anti-infectives    Start     Dose/Rate Route Frequency Ordered Stop   02/26/17 0800  piperacillin-tazobactam (ZOSYN) IVPB 3.375 g     3.375 g 12.5 mL/hr over 240 Minutes Intravenous Every 8 hours 02/26/17 0022     02/26/17 0030  piperacillin-tazobactam (ZOSYN) IVPB 3.375 g     3.375 g 100 mL/hr over 30 Minutes Intravenous  Once 02/26/17 0018 02/26/17 0117       Assessment/Plan Cholecystitis, cholelithiasis, biliary colic - IV abx, NPO, IVF  - WBC 16.8, afebrile - consented patient  - Possibly to OR today for lap cholecystectomy, possibly tomorrow.  FEN - NPO, IVF.  VTE - SCDs, HOLD LOVENOX  ID - IV Zosyn (6/29>>)  Plan: Keep NPO, IV abx. Plan for laparoscopic cholecystectomy today or tomorrow.   LOS: 1 day    Wells GuilesKelly Rayburn , [facility] 02/26/2017, 7:45 AM Pager: [phone] Consults: [phone] Mon-Fri 7:00 am-4:30 pm Sat-Sun 7:00 am-11:30 am

## 2017-02-26 NOTE — Transfer of Care (Signed)
Immediate Anesthesia Transfer of Care Note  Patient: David Horne  Procedure(s) Performed: Procedure(s): LAPAROSCOPIC CHOLECYSTECTOMY (N/A)  Patient Location: PACU  Anesthesia Type:General  Level of Consciousness: awake, alert  and oriented  Airway & Oxygen Therapy: Patient Spontanous Breathing and Patient connected to face mask oxygen  Post-op Assessment: Report given to RN, Post -op Vital signs reviewed and stable and Patient moving all extremities X 4  Post vital signs: Reviewed and stable  Last Vitals:  Vitals:   02/26/17 1210 02/26/17 1453  BP: 124/61   Pulse: (!) 108   Resp: 20   Temp: 37.5 C 36.7 C    Last Pain:  Vitals:   02/26/17 1453  TempSrc:   PainSc: 0-No pain      Patients Stated Pain Goal: 3 (02/26/17 0840)  Complications: No apparent anesthesia complications

## 2017-02-26 NOTE — Progress Notes (Signed)
Patient transported on BiPap from PACU to 4E-14 without complication.

## 2017-02-26 NOTE — H&P (View-Only) (Signed)
General Surgery - [facility]  Reason for Consult: abdominal pain, cholecystitis, cholelithiasis  Referring Physician: Dr. Nikki Carter, [facility]  David Horne is an 71 y.o. male.  HPI: patient is a 71 yo WM with sudden onset RUQ abd pain this AM.  This was followed by nausea and emesis.  Pain became more severe and patient presented to [facility] for evaluation.  USN demonstrated a large gallstone impacted in the neck of the gallbladder.  Patient requested transfer to [facility] where he has been a vascular surgery patient in the past.  He was directed to [facility] by Dr. Matt Wakefield.  No history of jaundice or acholic stools.  No prior abdominal surgery.  No hx of hepatitis or pancreatitis.  Denies fever or chills.  Daughter at bedside.  Past Medical History:  Diagnosis Date  . Anginal chest pain at rest (HCC) 12/03/2016   a. 11/2016: cath showing minimal CAD with normal EF.   . 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 Atherosclerosis of native artery of right lower extremity with intermittent claudication (HCC)   . CKD (chronic kidney disease), stage III   . Dyslipidemia 02/06/2013  . Esophageal reflux   . History of stomach ulcers    "took RX and it went away"  . Hypertension   . PAD (peripheral artery disease) (HCC)     Past Surgical History:  Procedure Laterality Date  . ABDOMINAL AORTAGRAM N/A 08/06/2014   Procedure: ABDOMINAL AORTAGRAM;  Surgeon: Christopher S Dickson, MD;  Location: [facility] CATH LAB;  Service: Cardiovascular;  Laterality: N/A;  . COLONOSCOPY W/ BIOPSIES AND POLYPECTOMY    . EXCISIONAL HEMORRHOIDECTOMY    . FEMORAL ARTERY - FEMORAL ARTERY BYPASS GRAFT  2005   Left to Right   . FEMORAL-FEMORAL BYPASS GRAFT Bilateral 09/10/2014   Procedure: REDO BYPASS GRAFT LEFT FEMORAL-RIGHT FEMORAL ARTERY;  Surgeon: Sagan Wurzel F Early, MD;  Location: [facility] OR;  Service: Vascular;  Laterality: Bilateral;  . LEFT  HEART CATH AND CORONARY ANGIOGRAPHY N/A 12/04/2016   Procedure: Left Heart Cath and Coronary Angiography;  Surgeon: Jayadeep S Varanasi, MD;  Location: [facility] INVASIVE CV LAB;  Service: Cardiovascular;  Laterality: N/A;    Family History  Problem Relation Age of Onset  . Deep vein thrombosis Sister        Blood Clots  . CVA Mother   . Heart disease Mother     Social History:  reports that he quit smoking about 16 years ago. His smoking use included Cigarettes. He has a 43.00 pack-year smoking history. He has never used smokeless tobacco. He reports that he drinks alcohol. He reports that he does not use drugs.  Allergies: No Known Allergies  Medications: I have reviewed the patient's current medications.  No results found for this or any previous visit (from the past 48 hour(s)).  No results found.  Review of Systems  Constitutional: Negative for chills, diaphoresis and fever.  HENT: Negative.   Eyes: Negative.   Respiratory: Negative.   Cardiovascular: Negative.   Gastrointestinal: Positive for abdominal pain (RUQ), nausea and vomiting. Negative for constipation and diarrhea.  Genitourinary: Negative.   Musculoskeletal: Negative.   Skin: Negative.   Neurological: Negative.   Endo/Heme/Allergies: Negative.   Psychiatric/Behavioral: Negative.    Blood pressure (!) 153/83, pulse (!) 109, temperature 98 F (36.7 C), temperature source Oral, resp. rate 18, height 6' 1" (1.854 m), weight 84.4 kg (186 lb). Physical Exam  Constitutional: He   is oriented to person, place, and time. He appears well-developed and well-nourished. No distress.  HENT:  Head: Normocephalic and atraumatic.  Right Ear: External ear normal.  Left Ear: External ear normal.  Eyes: Conjunctivae are normal. Pupils are equal, round, and reactive to light. No scleral icterus.  Neck: Normal range of motion. Neck supple. No tracheal deviation present. No thyromegaly present.  Cardiovascular: Normal rate, regular rhythm  and normal heart sounds.   No murmur heard. Respiratory: Effort normal and breath sounds normal. No respiratory distress. He has no wheezes.  GI: Soft. He exhibits distension (mild). He exhibits no mass. There is tenderness (mild to moderate RUQ). There is guarding (voluntary). There is no rebound.  Musculoskeletal: Normal range of motion. He exhibits no edema or deformity.  Neurological: He is alert and oriented to person, place, and time.  Skin: Skin is warm and dry. He is not diaphoretic.  Psychiatric: He has a normal mood and affect. His behavior is normal.    Assessment/Plan: Cholecystitis, cholelithiasis, biliary colic  Admit to medical service for evaluation and clearance for surgery  NPO after MN tonight for probable OR on Friday, 6/29, by Dr. Linzy Laury Rosenbower  Discussed laparoscopic cholecystectomy with patient and daughter at bedside.  Explained use of general anesthesia.  Discussed hospital stay and post op recovery.  Discussed possible need for conversion to open surgery.  They understand and wish to proceed with surgery as soon as possible.  Fawn Desrocher M. Manuella Blackson, MD, FACS [facility] Office: [phone]    Sheba Whaling M 02/25/2017, 8:41 PM     

## 2017-02-26 NOTE — Anesthesia Postprocedure Evaluation (Signed)
Anesthesia Post Note  Patient: David Horne  Procedure(s) Performed: Procedure(s) (LRB): LAPAROSCOPIC CHOLECYSTECTOMY (N/A)     Patient location during evaluation: PACU Anesthesia Type: General Level of consciousness: awake Pain management: pain level controlled Vital Signs Assessment: post-procedure vital signs reviewed and stable Respiratory status: spontaneous breathing Cardiovascular status: stable Anesthetic complications: no    Last Vitals:  Vitals:   02/26/17 1553 02/26/17 1608  BP: (!) 150/88 136/78  Pulse: (!) 102 91  Resp: 19 13  Temp:      Last Pain:  Vitals:   02/26/17 1552  TempSrc:   PainSc: 5                  Allesha Aronoff

## 2017-02-26 NOTE — Progress Notes (Signed)
Pharmacy Antibiotic Note  David KaufmanClyde Horne is a 71 y.o. male c/o RUQ pain admitted on 02/25/2017 with intra-abdominal infection.  Pharmacy has been consulted for zosyn dosing.  Plan: Zosyn 3.375g IV q8h (4 hour infusion).  F/u scr/cultures  Height: 6\' 1"  (185.4 cm) Weight: 186 lb (84.4 kg) IBW/kg (Calculated) : 79.9  Temp (24hrs), Avg:98.1 F (36.7 C), Min:98 F (36.7 C), Max:98.2 F (36.8 C)  No results for input(s): WBC, CREATININE, LATICACIDVEN, VANCOTROUGH, VANCOPEAK, VANCORANDOM, GENTTROUGH, GENTPEAK, GENTRANDOM, TOBRATROUGH, TOBRAPEAK, TOBRARND, AMIKACINPEAK, AMIKACINTROU, AMIKACIN in the last 168 hours.  CrCl cannot be calculated (Patient's most recent lab result is older than the maximum 21 days allowed.).    No Known Allergies  Antimicrobials this admission: 6/29 zosyn >>    >>   Dose adjustments this admission:   Microbiology results:  BCx:   UCx:    Sputum:   MRSA PCR:   Thank you for allowing pharmacy to be a part of this patient's care.  David Horne, David Horne 02/26/2017 12:21 AM

## 2017-02-26 NOTE — Anesthesia Preprocedure Evaluation (Addendum)
Anesthesia Evaluation  Patient identified by MRN, date of birth, ID band Patient awake    Reviewed: Allergy & Precautions, NPO status , Patient's Chart, lab work & pertinent test results  Airway Mallampati: I  TM Distance: >3 FB Neck ROM: Full    Dental  (+) Edentulous Upper, Edentulous Lower, Dental Advisory Given   Pulmonary former smoker,    breath sounds clear to auscultation       Cardiovascular hypertension, Pt. on medications + angina + Peripheral Vascular Disease   Rhythm:Regular Rate:Normal     Neuro/Psych    GI/Hepatic GERD  Medicated,  Endo/Other    Renal/GU Renal InsufficiencyRenal disease     Musculoskeletal   Abdominal   Peds  Hematology   Anesthesia Other Findings   Reproductive/Obstetrics                          Anesthesia Physical Anesthesia Plan  ASA: III  Anesthesia Plan: General   Post-op Pain Management:    Induction: Intravenous  PONV Risk Score and Plan: 2 and Ondansetron, Dexamethasone, Propofol and Treatment may vary due to age or medical condition  Airway Management Planned: Oral ETT  Additional Equipment:   Intra-op Plan:   Post-operative Plan: Extubation in OR and Possible Post-op intubation/ventilation  Informed Consent: I have reviewed the patients History and Physical, chart, labs and discussed the procedure including the risks, benefits and alternatives for the proposed anesthesia with the patient or authorized representative who has indicated his/her understanding and acceptance.   Dental advisory given  Plan Discussed with: CRNA, Anesthesiologist and Surgeon  Anesthesia Plan Comments:       Anesthesia Quick Evaluation

## 2017-02-26 NOTE — Progress Notes (Signed)
PROGRESS NOTE  David Horne  IWL:[id number] DOB: [birth year] DOA: 02/25/2017 PCP: Garner Gavel, MD  Outpatient Specialists: [facility] Brief Narrative: David Horne is a 71 y.o. 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 On arrival, he was tachycardic and afebrile. Work up revealed WBC 12, alk phos 146, otherwise unremarkable labs, and a RUQ U/S showing 2.5cm obstructing stone in gallbladder neck with + Murphy's sign. The patient was transferred to [facility] for surgical consultation, planning cholecystectomy 6/29.   Assessment & Plan: Principal Problem:   Acute cholecystitis Active Problems:   PVD (peripheral vascular disease) (HCC)   HTN, goal below 130/80   CKD (chronic kidney disease), stage III  Acute cholecystitis with obstructing stone in gallbladder neck on U/S:  - General surgery planning lap cholecystectomy 6/29 or 6/30 pending OR availability, may take to [facility].  - NPO, post-op diet per surgery. Maintenance IVF. - Analgesics, antiemetics ordered. - Empiric zosyn was started.   HTN: With G1DD on recent echocardiogram and preserved EF without WMA's.  - Continue home antihypertensives, norvasc, losartan (no AKI or hyperkalemia)  Stage III CKD: At baseline renal function, presumably hypertensive nephrosclerosis.  - IVF's, monitor electrolytes.   PAD and nonobstructive CAD on recent chateterization.  - Holding ASA for surgery.  - Otherwise, no further medication changes need to be made in anticipation for surgery. Pt's functional status is good with stable non-limiting claudication without angina. ECG nonischemic. No further cardiac work up necessary prior to surgery.   DVT prophylaxis: Lovenox Code Status:  Full Family Communication: None at bedside Disposition Plan: Pending postoperative course.   Consultants:   General surgery  Procedures:  12/04/16  Dr. Irish Lack  Left Heart Cath and Coronary Angiography    Mid RCA lesion, 25 %stenosed.  Prox RCA lesion, 10 %stenosed.  Mid LAD lesion, 10 %stenosed.  Ost 2nd Diag to 2nd Diag lesion, 20 %stenosed.  Mid Cx lesion, 10 %stenosed.  The left ventricular systolic function is normal.  LV end diastolic pressure is normal.  The left ventricular ejection fraction is 55-65% by visual estimate.  There is no aortic valve stenosis.  Radial approach used despite borderline Allens test given prior fem/fem bypass and redo.  No significant CAD. Normal LVEDP. No cardiac cause for anginal symptoms identified. Further plans per inpatient team.       Antimicrobials:  Zosyn 6/28 >>    Subjective: RUQ abd pain is severe, constant, waxing/waning, worse with palpation, better laying on left side, and improved/controlled with analgesics being administered here. No chest pain, palpitations, dyspnea.   Objective: BP 129/69 (BP Location: Right Arm)   Pulse (!) 108   Temp 98.1 F (36.7 C) (Oral)   Resp 20   Ht _0  (1.854 m)   Wt 84.4 kg (186 lb)   SpO2 95%   BMI 24.54 kg/m   Gen: Pleasant 71 y.o. male in no distress. Resp: Nonlabored and clear. CV: RRR, no murmur, no JVD, no edema.   GI: RUQ tenderness, +murphy's sign, nondistended, +BS. Neuro: Alert and oriented. No focal neurological deficits.  CBC:  Recent Labs Lab 02/26/17 0343  WBC 16.8*  NEUTROABS 14.7*  HGB 10.6*  HCT 33.5*  MCV 76.0*  PLT 368  Basic Metabolic Panel:  Recent Labs Lab 02/26/17 0343  NA 138  K 4.5  CL 107  CO2 22  GLUCOSE 135*  BUN 21*  CREATININE 1.49*  CALCIUM 8.7*   GFR: Estimated Creatinine Clearance: 51.4 mL/min (A) (by C-G formula based on SCr of 1.49 mg/dL (H)). Liver Function Tests:  Recent Labs Lab 02/26/17 0343  AST  26  ALT 22  ALKPHOS 101  BILITOT 0.9  PROT 7.3  ALBUMIN 3.8   Coagulation Profile:  Recent Labs Lab 02/26/17 0343  INR 1.13     LOS: 1 day   Time spent: 25 minutes.  Vance Gather, MD [facility] Pager [phone]  If 7PM-7AM, please contact night-coverage [email or web address] Password [facility] 02/26/2017, 7:31 AM

## 2017-02-26 NOTE — Plan of Care (Signed)
Problem: Education: Goal: Knowledge of [facility] Education information/materials will improve Outcome: Progressing Explained unit routine and safety conditions ie. Call don't fall for needs oriented to unit.

## 2017-02-27 ENCOUNTER — Inpatient Hospital Stay (HOSPITAL_COMMUNITY): Payer: Medicare Other

## 2017-02-27 ENCOUNTER — Encounter (HOSPITAL_COMMUNITY): Payer: Self-pay | Admitting: General Surgery

## 2017-02-27 LAB — BASIC METABOLIC PANEL
Anion gap: 6 (ref 5–15)
BUN: 22 mg/dL — AB (ref 6–20)
CHLORIDE: 106 mmol/L (ref 101–111)
CO2: 25 mmol/L (ref 22–32)
CREATININE: 1.6 mg/dL — AB (ref 0.61–1.24)
Calcium: 8.1 mg/dL — ABNORMAL LOW (ref 8.9–10.3)
GFR calc Af Amer: 48 mL/min — ABNORMAL LOW (ref >60)
GFR calc non Af Amer: 42 mL/min — ABNORMAL LOW (ref >60)
GLUCOSE: 110 mg/dL — AB (ref 65–99)
Potassium: 4 mmol/L (ref 3.5–5.1)
SODIUM: 137 mmol/L (ref 135–145)

## 2017-02-27 LAB — CBC
HCT: 30 % — ABNORMAL LOW (ref 39.0–52.0)
HEMOGLOBIN: 8.6 g/dL — AB (ref 13.0–17.0)
MCH: 22.5 pg — ABNORMAL LOW (ref 26.0–34.0)
MCHC: 28.7 g/dL — AB (ref 30.0–36.0)
MCV: 78.5 fL (ref 78.0–100.0)
Platelets: 225 10*3/uL (ref 150–400)
RBC: 3.82 MIL/uL — ABNORMAL LOW (ref 4.22–5.81)
RDW: 15 % (ref 11.5–15.5)
WBC: 13.9 10*3/uL — ABNORMAL HIGH (ref 4.0–10.5)

## 2017-02-27 MED ORDER — LACTATED RINGERS IV SOLN
INTRAVENOUS | Status: DC
  Administered 2017-02-27 – 2017-03-01 (×2): via INTRAVENOUS

## 2017-02-27 NOTE — Progress Notes (Addendum)
PROGRESS NOTE [facility]   Milton   ZHY:[id number] DOB: 25-Apr-1946  DOA: 02/25/2017 PCP: Garner Gavel, MD   Brief Narrative:  David Horne a 71 y.o.male with a history of HTN, HLD, PAD s/p fem-fem bypass and redo 2016, stage III CKD, nonobstructive CAD on cath April 2018, and GERD who presented to the ED at an OSH for evaluation of new onset RUQ pain that awakened him out of sleep around 5AM described as severe, intermittent, sharp, associated with N/V without fever. On arrival, he was tachycardic and afebrile. Work up revealed WBC 12, alk phos 146, otherwise unremarkable labs, and a RUQ U/S showing 2.5cm obstructing stone in gallbladder neck with + Murphy's sign. S/p Lap chole on 6/29  Subjective: Patient seen and examined, doing well, report mild diffuse abdominal pain, which is controlled with medication. No flatus or BM. Report good urine output. Denies chest pain, SOB and palpitations.   Assessment & Plan: Acute cholecystitis with obstructing stone in gallbladder neck on U/S:  S/p Lap chole 6/29  Drain in place serosanguineous  Advance diet per surgical team  Continue IV abx for now  Encourage ambulation   HTN: With G1DD on recent echocardiogram and preserved EF without WMA's.  Continue home antihypertensives, norvasc, losartan   AKI on Stage III CKD:  sCr slight elevated - likely hypoperfusion from surgery  Continue IVF, monitor BMP in AM   PAD and nonobstructive CAD on recent chateterization.  Asymptomatic   Acute Hypoxia - likely atelectasis s/p surgery, lowest O2 sat 87% non sustained.  CXR ordered - no acute abnormalities  Wean O2 as tolerated keep saturation > 91% Incentive spirometry  OOB as tolerated   Monitor   Anemia - multifactorial chronic diseases from CKD, hemodilution and blood loss from surgery. Monitor CBC in AM  Anemia panel  Transfuse if < 7   DVT prophylaxis: Lovenox  Code Status: FULL  Family Communication: Non at bedside    Disposition Plan: Home when cleared by surgery   Consultants:   General surgery   Procedures:  Lap Chole 02/26/17  Antimicrobials: Anti-infectives    Start     Dose/Rate Horne Frequency Ordered Stop   02/27/17 0200  piperacillin-tazobactam (ZOSYN) IVPB 3.375 g     3.375 g 12.5 mL/hr over 240 Minutes Intravenous Every 8 hours 02/26/17 1748     02/26/17 1800  piperacillin-tazobactam (ZOSYN) IVPB 3.375 g     3.375 g 12.5 mL/hr over 240 Minutes Intravenous  Once 02/26/17 1748 02/26/17 2206   02/26/17 0800  piperacillin-tazobactam (ZOSYN) IVPB 3.375 g  Status:  Discontinued     3.375 g 12.5 mL/hr over 240 Minutes Intravenous Every 8 hours 02/26/17 0022 02/26/17 1748   02/26/17 0030  piperacillin-tazobactam (ZOSYN) IVPB 3.375 g     3.375 g 100 mL/hr over 30 Minutes Intravenous  Once 02/26/17 0018 02/26/17 0117         Objective: Vitals:   02/26/17 2021 02/27/17 0005 02/27/17 0338 02/27/17 0752  BP: (!) 162/99 (!) 145/93 130/84   Pulse:  (!) 111 95   Resp:  (!) 26 19   Temp: 99.8 F (37.7 C) 99.6 F (37.6 C) 98.7 F (37.1 C) 98.7 F (37.1 C)  TempSrc: Oral Oral Oral Oral  SpO2: 96% 96% (!) 87%   Weight: 82.1 kg (181 lb)     Height: _0  (1.854 m)       Intake/Output Summary (Last 24 hours) at 02/27/17 0833 Last data filed at 02/27/17 2952  Gross  per 24 hour  Intake             2910 ml  Output              505 ml  Net             2405 ml   Filed Weights   02/25/17 1815 02/26/17 2021  Weight: 84.4 kg (186 lb) 82.1 kg (181 lb)    Examination:  General exam: Appears calm and comfortable  HEENT: AC/AT, PERRLA, OP moist and clear Respiratory system: Good air entry, some rales bibasilar, no wheezing.  Cardiovascular system: S1 & S2 heard, RRR. No JVD, murmurs, rubs or gallops Gastrointestinal system: Soft, mild distended, mild TTP diffusely. Drain in place with serosanguineous material. No guarding  Central nervous system: Alert and oriented. No focal  neurological deficits. Extremities: No LE edema. Skin: No rashes, lesions or ulcers Psychiatry: Judgement and insight appear normal. Mood & affect appropriate.    Data Reviewed: I have personally reviewed following labs and imaging studies  CBC:  Recent Labs Lab 02/26/17 0343 02/27/17 0230  WBC 16.8* 13.9*  NEUTROABS 14.7*  --   HGB 10.6* 8.6*  HCT 33.5* 30.0*  MCV 76.0* 78.5  PLT 368 016   Basic Metabolic Panel:  Recent Labs Lab 02/26/17 0343 02/27/17 0230  NA 138 137  K 4.5 4.0  CL 107 106  CO2 22 25  GLUCOSE 135* 110*  BUN 21* 22*  CREATININE 1.49* 1.60*  CALCIUM 8.7* 8.1*   GFR: Estimated Creatinine Clearance: 47.9 mL/min (A) (by C-G formula based on SCr of 1.6 mg/dL (H)). Liver Function Tests:  Recent Labs Lab 02/26/17 0343  AST 26  ALT 22  ALKPHOS 101  BILITOT 0.9  PROT 7.3  ALBUMIN 3.8   No results for input(s): LIPASE, AMYLASE in the last 168 hours. No results for input(s): AMMONIA in the last 168 hours. Coagulation Profile:  Recent Labs Lab 02/26/17 0343  INR 1.13   Cardiac Enzymes: No results for input(s): CKTOTAL, CKMB, CKMBINDEX, TROPONINI in the last 168 hours. BNP (last 3 results) No results for input(s): PROBNP in the last 8760 hours. HbA1C: No results for input(s): HGBA1C in the last 72 hours. CBG: No results for input(s): GLUCAP in the last 168 hours. Lipid Profile: No results for input(s): CHOL, HDL, LDLCALC, TRIG, CHOLHDL, LDLDIRECT in the last 72 hours. Thyroid Function Tests: No results for input(s): TSH, T4TOTAL, FREET4, T3FREE, THYROIDAB in the last 72 hours. Anemia Panel: No results for input(s): VITAMINB12, FOLATE, FERRITIN, TIBC, IRON, RETICCTPCT in the last 72 hours. Sepsis Labs: No results for input(s): PROCALCITON, LATICACIDVEN in the last 168 hours.  Recent Results (from the past 240 hour(s))  MRSA PCR Screening     Status: None   Collection Time: 02/26/17 12:05 PM  Result Value Ref Range Status   MRSA by  PCR NEGATIVE NEGATIVE Final    Comment:        The GeneXpert MRSA Assay (FDA approved for NASAL specimens only), is one component of a comprehensive MRSA colonization surveillance program. It is not intended to diagnose MRSA infection nor to guide or monitor treatment for MRSA infections.       Radiology Studies: Dg Chest Port 1 View  Result Date: 02/27/2017 CLINICAL DATA:  SOB. Pt reports abdominal pain along incision site today, and productive cough yesterday after gallbladder surgery. Pt denies fever or n/v. Hx PAD, HTN, former smoker. EXAM: PORTABLE CHEST 1 VIEW COMPARISON:  12/03/2016 FINDINGS: Cardiac silhouette  is normal in size. No mediastinal or hilar masses. No convincing adenopathy. There is irregular interstitial thickening most evident in the lower lungs, accentuated, particularly on the right, by low lung volumes. There is no evidence of pneumonia or pulmonary edema. Mild stable scarring is noted in the upper lobes mostly at the apices. No pleural effusion or pneumothorax. Skeletal structures are demineralized but grossly intact. IMPRESSION: 1. No acute cardiopulmonary disease. No significant change from prior study allowing for lower lung volumes. Electronically Signed   By: Lajean Manes M.D.   On: 02/27/2017 08:15    Scheduled Meds: . amLODipine  10 mg Oral Daily  . [START ON 02/28/2017] enoxaparin (LOVENOX) injection  40 mg Subcutaneous Q24H  . pantoprazole  40 mg Oral Daily   Continuous Infusions: . lactated ringers    . piperacillin-tazobactam (ZOSYN)  IV 3.375 g (02/27/17 0259)     LOS: 2 days    Time spent: Total of 25 minutes spent with pt, greater than 50% of which was spent in discussion of  treatment, counseling and coordination of care   Chipper Oman, MD Pager: Text Page via [email or web address]  [phone]  If 7PM-7AM, please contact night-coverage [email or web address] Password [facility] 02/27/2017, 8:33 AM

## 2017-02-27 NOTE — Plan of Care (Signed)
Problem: Pain Managment: Goal: General experience of comfort will improve Outcome: Progressing Pain is at goal with minimal meds

## 2017-02-27 NOTE — Progress Notes (Signed)
Patient ID: David Horne, male   DOB: 1946-03-05, 71 y.o.   MRN: [id number]  [facility] Surgery Progress Note  1 Day Post-Op  Subjective: CC- gangrenous cholecystitis Feeling ok this morning. States that he has intermittent right sided abdominal pain. Worse with movement and when pain medication wears off. Denies n/v. No BM or flatus. Tolerating clears. Off BiPAP.  Objective: Vital signs in last 24 hours: Temp:  [98 F (36.7 C)-99.8 F (37.7 C)] 98.7 F (37.1 C) (06/30 0752) Pulse Rate:  [84-111] 95 (06/30 0338) Resp:  [12-28] 19 (06/30 0338) BP: (118-162)/(61-104) 130/84 (06/30 0338) SpO2:  [87 %-98 %] 87 % (06/30 0338) FiO2 (%):  [60 %] 60 % (06/29 2021) Weight:  [82.1 kg (181 lb)] 82.1 kg (181 lb) (06/29 2021) Last BM Date: 02/25/17  Intake/Output from previous day: 06/29 0701 - 06/30 0700 In: 2910 [I.V.:2810; IV Piggyback:100] Out: 805 [Urine:700; Drains:85; Blood:20] Intake/Output this shift: No intake/output data recorded.  PE: Gen:  Alert, NAD, pleasant HEENT: EOM's intact, pupils equal  Card:  RRR, no M/G/R heard Pulm:  Few scattered rhonchi, no wheezes, effort normal on 6L Day Abd: Soft, distended, mild right sided tenderness, +BS, incisions C/D/I with steris in place, drain with serosanguinous drainage Psych: A&Ox3  Skin: no rashes noted, warm and dry  Lab Results:   Recent Labs  02/26/17 0343 02/27/17 0230  WBC 16.8* 13.9*  HGB 10.6* 8.6*  HCT 33.5* 30.0*  PLT 368 225   BMET  Recent Labs  02/26/17 0343 02/27/17 0230  NA 138 137  K 4.5 4.0  CL 107 106  CO2 22 25  GLUCOSE 135* 110*  BUN 21* 22*  CREATININE 1.49* 1.60*  CALCIUM 8.7* 8.1*   PT/INR  Recent Labs  02/26/17 0343  LABPROT 14.5  INR 1.13   CMP     Component Value Date/Time   NA 137 02/27/2017 0230   NA 142 12/17/2016 1352   K 4.0 02/27/2017 0230   CL 106 02/27/2017 0230   CO2 25 02/27/2017 0230   GLUCOSE 110 (H) 02/27/2017 0230   BUN 22 (H) 02/27/2017 0230   BUN  21 12/17/2016 1352   CREATININE 1.60 (H) 02/27/2017 0230   CALCIUM 8.1 (L) 02/27/2017 0230   PROT 7.3 02/26/2017 0343   ALBUMIN 3.8 02/26/2017 0343   AST 26 02/26/2017 0343   ALT 22 02/26/2017 0343   ALKPHOS 101 02/26/2017 0343   BILITOT 0.9 02/26/2017 0343   GFRNONAA 42 (L) 02/27/2017 0230   GFRAA 48 (L) 02/27/2017 0230   Lipase  No results found for: LIPASE     Studies/Results: Dg Chest Port 1 View  Result Date: 02/27/2017 CLINICAL DATA:  SOB. Pt reports abdominal pain along incision site today, and productive cough yesterday after gallbladder surgery. Pt denies fever or n/v. Hx PAD, HTN, former smoker. EXAM: PORTABLE CHEST 1 VIEW COMPARISON:  12/03/2016 FINDINGS: Cardiac silhouette is normal in size. No mediastinal or hilar masses. No convincing adenopathy. There is irregular interstitial thickening most evident in the lower lungs, accentuated, particularly on the right, by low lung volumes. There is no evidence of pneumonia or pulmonary edema. Mild stable scarring is noted in the upper lobes mostly at the apices. No pleural effusion or pneumothorax. Skeletal structures are demineralized but grossly intact. IMPRESSION: 1. No acute cardiopulmonary disease. No significant change from prior study allowing for lower lung volumes. Electronically Signed   By: Amie Portland M.D.   On: 02/27/2017 08:15    Anti-infectives: Anti-infectives  Start     Dose/Rate Route Frequency Ordered Stop   02/27/17 0200  piperacillin-tazobactam (ZOSYN) IVPB 3.375 g     3.375 g 12.5 mL/hr over 240 Minutes Intravenous Every 8 hours 02/26/17 1748     02/26/17 1800  piperacillin-tazobactam (ZOSYN) IVPB 3.375 g     3.375 g 12.5 mL/hr over 240 Minutes Intravenous  Once 02/26/17 1748 02/26/17 2206   02/26/17 0800  piperacillin-tazobactam (ZOSYN) IVPB 3.375 g  Status:  Discontinued     3.375 g 12.5 mL/hr over 240 Minutes Intravenous Every 8 hours 02/26/17 0022 02/26/17 1748   02/26/17 0030   piperacillin-tazobactam (ZOSYN) IVPB 3.375 g     3.375 g 100 mL/hr over 30 Minutes Intravenous  Once 02/26/17 0018 02/26/17 0117       Assessment/Plan HTN CKD PAD and nonobstructive CAD on recent chateterization  Gangrenous cholecystitis S/p lap chole 6/29 [place] - POD 1 - drain with 85cc output serosanguinous  ID - zosyn 6/29>>day#2. Will need 5 total days of antibiotics. FEN - IVF, CLD VTE - SCDs, lovenox  Plan - continue clear liquids until better return in bowel function. Ambulate today. Continue drain and IV antibiotics.    LOS: 2 days    Edson SnowballBROOKE A MILLER , [facility] 02/27/2017, 9:32 AM Pager: [phone] Consults: [phone] Mon-Fri 7:00 am-4:30 pm Sat-Sun 7:00 am-11:30 am

## 2017-02-28 LAB — CBC WITH DIFFERENTIAL/PLATELET
BASOS PCT: 0 %
Basophils Absolute: 0 10*3/uL (ref 0.0–0.1)
Eosinophils Absolute: 0.4 10*3/uL (ref 0.0–0.7)
Eosinophils Relative: 4 %
HEMATOCRIT: 28.6 % — AB (ref 39.0–52.0)
HEMOGLOBIN: 8.3 g/dL — AB (ref 13.0–17.0)
Lymphocytes Relative: 13 %
Lymphs Abs: 1.3 10*3/uL (ref 0.7–4.0)
MCH: 22.9 pg — ABNORMAL LOW (ref 26.0–34.0)
MCHC: 29 g/dL — AB (ref 30.0–36.0)
MCV: 79 fL (ref 78.0–100.0)
MONOS PCT: 7 %
Monocytes Absolute: 0.7 10*3/uL (ref 0.1–1.0)
NEUTROS ABS: 7.3 10*3/uL (ref 1.7–7.7)
NEUTROS PCT: 76 %
Platelets: 224 10*3/uL (ref 150–400)
RBC: 3.62 MIL/uL — ABNORMAL LOW (ref 4.22–5.81)
RDW: 14.8 % (ref 11.5–15.5)
WBC: 9.7 10*3/uL (ref 4.0–10.5)

## 2017-02-28 LAB — RETICULOCYTES
RBC.: 3.62 MIL/uL — ABNORMAL LOW (ref 4.22–5.81)
Retic Count, Absolute: 32.6 10*3/uL (ref 19.0–186.0)
Retic Ct Pct: 0.9 % (ref 0.4–3.1)

## 2017-02-28 LAB — BASIC METABOLIC PANEL
ANION GAP: 5 (ref 5–15)
BUN: 15 mg/dL (ref 6–20)
CHLORIDE: 103 mmol/L (ref 101–111)
CO2: 27 mmol/L (ref 22–32)
CREATININE: 1.49 mg/dL — AB (ref 0.61–1.24)
Calcium: 8.3 mg/dL — ABNORMAL LOW (ref 8.9–10.3)
GFR calc non Af Amer: 45 mL/min — ABNORMAL LOW (ref >60)
GFR, EST AFRICAN AMERICAN: 53 mL/min — AB (ref >60)
GLUCOSE: 103 mg/dL — AB (ref 65–99)
Potassium: 3.4 mmol/L — ABNORMAL LOW (ref 3.5–5.1)
Sodium: 135 mmol/L (ref 135–145)

## 2017-02-28 LAB — FERRITIN: Ferritin: 44 ng/mL (ref 24–336)

## 2017-02-28 LAB — IRON AND TIBC
IRON: 12 ug/dL — AB (ref 45–182)
SATURATION RATIOS: 4 % — AB (ref 17.9–39.5)
TIBC: 318 ug/dL (ref 250–450)
UIBC: 306 ug/dL

## 2017-02-28 LAB — FOLATE: FOLATE: 21.7 ng/mL (ref >5.9)

## 2017-02-28 LAB — VITAMIN B12: Vitamin B-12: 409 pg/mL (ref 180–914)

## 2017-02-28 MED ORDER — POTASSIUM CHLORIDE CRYS ER 20 MEQ PO TBCR: 20.0000 meq | EXTENDED_RELEASE_TABLET | Freq: Two times a day (BID) | ORAL | Status: DC

## 2017-02-28 MED ORDER — POTASSIUM CHLORIDE CRYS ER 20 MEQ PO TBCR
20.0000 meq | EXTENDED_RELEASE_TABLET | Freq: Two times a day (BID) | ORAL | Status: AC
  Administered 2017-02-28 (×2): 20 meq via ORAL
  Filled 2017-02-28 (×2): qty 1

## 2017-02-28 MED ORDER — HYDROMORPHONE HCL 1 MG/ML IJ SOLN: 0.5000 mg | INTRAMUSCULAR | Status: DC | PRN

## 2017-02-28 MED ORDER — ASPIRIN 81 MG PO CHEW
81.0000 mg | CHEWABLE_TABLET | Freq: Every day | ORAL | Status: DC
  Administered 2017-02-28 – 2017-03-03 (×4): 81 mg via ORAL
  Filled 2017-02-28 (×4): qty 1

## 2017-02-28 NOTE — Progress Notes (Signed)
PROGRESS NOTE [facility]   [place]   BLT:[id number] DOB: 28-Apr-1946  DOA: 02/25/2017 PCP: Garner Gavel, MD   Brief Narrative:  David Horne a 71 y.o.male with a history of HTN, HLD, PAD s/p fem-fem bypass and redo 2016, stage III CKD, nonobstructive CAD on cath April 2018, and GERD who presented to the ED at an OSH for evaluation of new onset RUQ pain that awakened him out of sleep around 5AM described as severe, intermittent, sharp, associated with N/V without fever. On arrival, he was tachycardic and afebrile. Work up revealed WBC 12, alk phos 146, otherwise unremarkable labs, and a RUQ U/S showing 2.5cm obstructing stone in gallbladder neck with + Murphy's sign. S/p Lap chole on 6/29  Subjective: Doing well, no flatus or BM yet, still distended. Tolerating clear liquids. Denies nausea and vomiting. No chest pain or SOB.    Assessment & Plan: Acute cholecystitis with obstructing stone in gallbladder neck on U/S:  S/p Lap chole 6/29, Drain continues serosanguinous  On IV zosyn - total of 5 days of abx  Continue management per surgical team  Pain management as needed  Encourage ambulation  Transfer to med surg.   HTN: With G1DD on recent echocardiogram and preserved EF without WMA's.  BP stable  Continue current regimen. Amlodipine and Lopressor  Holding Lopressor in view of AKI   AKI on Stage III CKD:  sCr slight elevated - likely hypoperfusion from surgery  Improved with IVF - will continue to gentle hydration  Monitor BMP in AM   PAD and nonobstructive CAD on recent chateterization.  Asymptomatic  Will resume ASA   Acute Hypoxia - likely atelectasis s/p surgery, lowest O2 sat 87% non sustained.   CXR ordered - no acute abnormalities  Wean O2 as tolerated keep saturation > 91% Incentive spirometry  OOB as tolerated    Anemia - multifactorial chronic diseases from CKD, hemodilution and blood loss from surgery. Iron deficiency  HGB stable  Will start  Iron prior to d/c Monitor CBC in AM  Transfuse if < 7   DVT prophylaxis: Lovenox  Code Status: FULL  Family Communication: Non at bedside  Disposition Plan: Transfer to [facility]   Consultants:   General surgery   Procedures:  Lap Chole 02/26/17  Antimicrobials: Anti-infectives    Start     Dose/Rate Horne Frequency Ordered Stop   02/27/17 0200  piperacillin-tazobactam (ZOSYN) IVPB 3.375 g     3.375 g 12.5 mL/hr over 240 Minutes Intravenous Every 8 hours 02/26/17 1748     02/26/17 1800  piperacillin-tazobactam (ZOSYN) IVPB 3.375 g     3.375 g 12.5 mL/hr over 240 Minutes Intravenous  Once 02/26/17 1748 02/26/17 2206   02/26/17 0800  piperacillin-tazobactam (ZOSYN) IVPB 3.375 g  Status:  Discontinued     3.375 g 12.5 mL/hr over 240 Minutes Intravenous Every 8 hours 02/26/17 0022 02/26/17 1748   02/26/17 0030  piperacillin-tazobactam (ZOSYN) IVPB 3.375 g     3.375 g 100 mL/hr over 30 Minutes Intravenous  Once 02/26/17 0018 02/26/17 0117        Objective: Vitals:   02/27/17 2027 02/27/17 2325 02/28/17 0345 02/28/17 0759  BP: 123/72 137/68 122/70 127/67  Pulse: 73 100 74 79  Resp: 13 (!) 22 18 17   Temp: 98.3 F (36.8 C) 98.4 F (36.9 C) 98.1 F (36.7 C) 98.4 F (36.9 C)  TempSrc: Oral Oral Oral Oral  SpO2: 91% 90% 90% 90%  Weight:      Height:  Intake/Output Summary (Last 24 hours) at 02/28/17 0842 Last data filed at 02/28/17 0500  Gross per 24 hour  Intake           1182.5 ml  Output              760 ml  Net            422.5 ml   Filed Weights   02/25/17 1815 02/26/17 2021  Weight: 84.4 kg (186 lb) 82.1 kg (181 lb)    Examination:  General: Pt is alert, awake, not in acute distress Cardiovascular: RRR, S1/S2 +, no rubs, no gallops Respiratory: Good air entry, mild rales at the bases, no wheezing or crackles Abdominal:  Soft, distended, TTP in RUQ, +BS, incision intact and clear, drain serosanguineous  Extremities: no edema, no cyanosis  Data  Reviewed: I have personally reviewed following labs and imaging studies  CBC:  Recent Labs Lab 02/26/17 0343 02/27/17 0230 02/28/17 0332  WBC 16.8* 13.9* 9.7  NEUTROABS 14.7*  --  7.3  HGB 10.6* 8.6* 8.3*  HCT 33.5* 30.0* 28.6*  MCV 76.0* 78.5 79.0  PLT 368 225 097   Basic Metabolic Panel:  Recent Labs Lab 02/26/17 0343 02/27/17 0230 02/28/17 0332  NA 138 137 135  K 4.5 4.0 3.4*  CL 107 106 103  CO2 22 25 27   GLUCOSE 135* 110* 103*  BUN 21* 22* 15  CREATININE 1.49* 1.60* 1.49*  CALCIUM 8.7* 8.1* 8.3*   GFR: Estimated Creatinine Clearance: 51.4 mL/min (A) (by C-G formula based on SCr of 1.49 mg/dL (H)). Liver Function Tests:  Recent Labs Lab 02/26/17 0343  AST 26  ALT 22  ALKPHOS 101  BILITOT 0.9  PROT 7.3  ALBUMIN 3.8   No results for input(s): LIPASE, AMYLASE in the last 168 hours. No results for input(s): AMMONIA in the last 168 hours. Coagulation Profile:  Recent Labs Lab 02/26/17 0343  INR 1.13   Cardiac Enzymes: No results for input(s): CKTOTAL, CKMB, CKMBINDEX, TROPONINI in the last 168 hours. BNP (last 3 results) No results for input(s): PROBNP in the last 8760 hours. HbA1C: No results for input(s): HGBA1C in the last 72 hours. CBG: No results for input(s): GLUCAP in the last 168 hours. Lipid Profile: No results for input(s): CHOL, HDL, LDLCALC, TRIG, CHOLHDL, LDLDIRECT in the last 72 hours. Thyroid Function Tests: No results for input(s): TSH, T4TOTAL, FREET4, T3FREE, THYROIDAB in the last 72 hours. Anemia Panel:  Recent Labs  02/28/17 0332  VITAMINB12 409  FOLATE 21.7  FERRITIN 44  TIBC 318  IRON 12*  RETICCTPCT 0.9   Sepsis Labs: No results for input(s): PROCALCITON, LATICACIDVEN in the last 168 hours.  Recent Results (from the past 240 hour(s))  MRSA PCR Screening     Status: None   Collection Time: 02/26/17 12:05 PM  Result Value Ref Range Status   MRSA by PCR NEGATIVE NEGATIVE Final    Comment:        The GeneXpert  MRSA Assay (FDA approved for NASAL specimens only), is one component of a comprehensive MRSA colonization surveillance program. It is not intended to diagnose MRSA infection nor to guide or monitor treatment for MRSA infections.       Radiology Studies: Dg Chest Port 1 View  Result Date: 02/27/2017 CLINICAL DATA:  SOB. Pt reports abdominal pain along incision site today, and productive cough yesterday after gallbladder surgery. Pt denies fever or n/v. Hx PAD, HTN, former smoker. EXAM: PORTABLE CHEST 1 VIEW COMPARISON:  12/03/2016 FINDINGS: Cardiac silhouette is normal in size. No mediastinal or hilar masses. No convincing adenopathy. There is irregular interstitial thickening most evident in the lower lungs, accentuated, particularly on the right, by low lung volumes. There is no evidence of pneumonia or pulmonary edema. Mild stable scarring is noted in the upper lobes mostly at the apices. No pleural effusion or pneumothorax. Skeletal structures are demineralized but grossly intact. IMPRESSION: 1. No acute cardiopulmonary disease. No significant change from prior study allowing for lower lung volumes. Electronically Signed   By: Lajean Manes M.D.   On: 02/27/2017 08:15    Scheduled Meds: . amLODipine  10 mg Oral Daily  . enoxaparin (LOVENOX) injection  40 mg Subcutaneous Q24H  . pantoprazole  40 mg Oral Daily  . potassium chloride  20 mEq Oral BID   Continuous Infusions: . lactated ringers 50 mL/hr at 02/28/17 0500  . piperacillin-tazobactam (ZOSYN)  IV Stopped (02/28/17 0700)     LOS: 3 days    Time spent: Total of 15 minutes spent with pt, greater than 50% of which was spent in discussion of  treatment, counseling and coordination of care   Chipper Oman, MD Pager: Text Page via [email or web address]  [phone]  If 7PM-7AM, please contact night-coverage [email or web address] Password [facility] 02/28/2017, 8:42 AM

## 2017-02-28 NOTE — Progress Notes (Signed)
Patient ID: David KaufmanClyde Horne, male   DOB: 10/16/1945, 71 y.o.   MRN: [id number]  [facility]  2 Days Post-Op  Subjective: CC- gangrenous cholecystitis Patient reports less pain than yesterday. Got up to chair for several hours yesterday. No BM or flatus. Denies n/v. Tolerating clears. Has remained off BiPAP. O2 sats stable, weaning supplemental O2.  Objective: Vital signs in last 24 hours: Temp:  [97.1 F (36.2 C)-98.4 F (36.9 C)] 98.4 F (36.9 C) (07/01 0759) Pulse Rate:  [73-100] 74 (07/01 0345) Resp:  [13-22] 18 (07/01 0345) BP: (119-137)/(60-72) 122/70 (07/01 0345) SpO2:  [90 %-91 %] 90 % (07/01 0345) Last BM Date: 02/25/17  Intake/Output from previous day: 06/30 0701 - 07/01 0700 In: 1182.5 [P.O.:120; I.V.:1062.5] Out: 760 [Urine:700; Drains:60] Intake/Output this shift: No intake/output data recorded.  PE: Gen:  Alert, NAD, pleasant HEENT: EOM's intact, pupils equal  Card:  RRR, no M/G/R heard Pulm:  Few scattered rhonchi, no wheezes, effort normal on 4L [place] Abd: Soft, distended, mild right sided tenderness, +BS, incisions C/D/I with steris in place, drain with serosanguinous drainage Psych: A&Ox3  Skin: no rashes noted, warm and dry   Lab Results:   Recent Labs  02/27/17 0230 02/28/17 0332  WBC 13.9* 9.7  HGB 8.6* 8.3*  HCT 30.0* 28.6*  PLT 225 224   BMET  Recent Labs  02/27/17 0230 02/28/17 0332  NA 137 135  K 4.0 3.4*  CL 106 103  CO2 25 27  GLUCOSE 110* 103*  BUN 22* 15  CREATININE 1.60* 1.49*  CALCIUM 8.1* 8.3*   PT/INR  Recent Labs  02/26/17 0343  LABPROT 14.5  INR 1.13   CMP     Component Value Date/Time   NA 135 02/28/2017 0332   NA 142 12/17/2016 1352   K 3.4 (L) 02/28/2017 0332   CL 103 02/28/2017 0332   CO2 27 02/28/2017 0332   GLUCOSE 103 (H) 02/28/2017 0332   BUN 15 02/28/2017 0332   BUN 21 12/17/2016 1352   CREATININE 1.49 (H) 02/28/2017 0332   CALCIUM 8.3 (L) 02/28/2017 0332   PROT 7.3  02/26/2017 0343   ALBUMIN 3.8 02/26/2017 0343   AST 26 02/26/2017 0343   ALT 22 02/26/2017 0343   ALKPHOS 101 02/26/2017 0343   BILITOT 0.9 02/26/2017 0343   GFRNONAA 45 (L) 02/28/2017 0332   GFRAA 53 (L) 02/28/2017 0332   Lipase  No results found for: LIPASE     Studies/Results: Dg Chest Port 1 View  Result Date: 02/27/2017 CLINICAL DATA:  SOB. Pt reports abdominal pain along incision site today, and productive cough yesterday after gallbladder surgery. Pt denies fever or n/v. Hx PAD, HTN, former smoker. EXAM: PORTABLE CHEST 1 VIEW COMPARISON:  12/03/2016 FINDINGS: Cardiac silhouette is normal in size. No mediastinal or hilar masses. No convincing adenopathy. There is irregular interstitial thickening most evident in the lower lungs, accentuated, particularly on the right, by low lung volumes. There is no evidence of pneumonia or pulmonary edema. Mild stable scarring is noted in the upper lobes mostly at the apices. No pleural effusion or pneumothorax. Skeletal structures are demineralized but grossly intact. IMPRESSION: 1. No acute cardiopulmonary disease. No significant change from prior study allowing for lower lung volumes. Electronically Signed   By: David Horne  Ormond M.D.   On: 02/27/2017 08:15    Anti-infectives: Anti-infectives    Start     Dose/Rate Route Frequency Ordered Stop   02/27/17 0200  piperacillin-tazobactam (ZOSYN) IVPB 3.375 g  3.375 g 12.5 mL/hr over 240 Minutes Intravenous Every 8 hours 02/26/17 1748     02/26/17 1800  piperacillin-tazobactam (ZOSYN) IVPB 3.375 g     3.375 g 12.5 mL/hr over 240 Minutes Intravenous  Once 02/26/17 1748 02/26/17 2206   02/26/17 0800  piperacillin-tazobactam (ZOSYN) IVPB 3.375 g  Status:  Discontinued     3.375 g 12.5 mL/hr over 240 Minutes Intravenous Every 8 hours 02/26/17 0022 02/26/17 1748   02/26/17 0030  piperacillin-tazobactam (ZOSYN) IVPB 3.375 g     3.375 g 100 mL/hr over 30 Minutes Intravenous  Once 02/26/17 0018  02/26/17 0117       Assessment/Plan HTN CKD PAD and nonobstructive CAD on recent catheterization ABL anemia - Hg 8.3, stable Hypokalemia - K 3.4, give oral replacement AKI on CKD-III - Cr trending down with IVF  Gangrenous cholecystitis S/p lap chole 6/29 [place] - POD 2 - drain with 60cc output serosanguinous. Drain can be removed prior to discharge if non-bilious - WBC WNL  ID - zosyn 6/29>>day#3. Will need 5 total days of antibiotics. FEN - IVF, CLD VTE - SCDs, lovenox  Plan - transfer to med surg. continue clear liquids until better return in bowel function. Ambulate more today. Continue drain and IV antibiotics. Replace potassium. Labs in AM.   LOS: 3 days    Edson Snowball , [facility] Surgery 02/28/2017, 8:25 AM Pager: [phone] Consults: [phone] Mon-Fri 7:00 am-4:30 pm Sat-Sun 7:00 am-11:30 am

## 2017-02-28 NOTE — Progress Notes (Signed)
Pt  Arrived to room 6n16 from 4E at this time, oriented to room and surroundings

## 2017-03-01 DIAGNOSIS — R0602 Shortness of breath: Secondary | ICD-10-CM

## 2017-03-01 LAB — CBC
HEMATOCRIT: 30.1 % — AB (ref 39.0–52.0)
Hemoglobin: 8.8 g/dL — ABNORMAL LOW (ref 13.0–17.0)
MCH: 23 pg — ABNORMAL LOW (ref 26.0–34.0)
MCHC: 29.2 g/dL — ABNORMAL LOW (ref 30.0–36.0)
MCV: 78.6 fL (ref 78.0–100.0)
Platelets: 245 10*3/uL (ref 150–400)
RBC: 3.83 MIL/uL — AB (ref 4.22–5.81)
RDW: 15 % (ref 11.5–15.5)
WBC: 7.8 10*3/uL (ref 4.0–10.5)

## 2017-03-01 LAB — BASIC METABOLIC PANEL
ANION GAP: 6 (ref 5–15)
BUN: 10 mg/dL (ref 6–20)
CO2: 25 mmol/L (ref 22–32)
Calcium: 8.4 mg/dL — ABNORMAL LOW (ref 8.9–10.3)
Chloride: 109 mmol/L (ref 101–111)
Creatinine, Ser: 1.3 mg/dL — ABNORMAL HIGH (ref 0.61–1.24)
GFR calc non Af Amer: 54 mL/min — ABNORMAL LOW (ref >60)
GLUCOSE: 106 mg/dL — AB (ref 65–99)
POTASSIUM: 3.7 mmol/L (ref 3.5–5.1)
Sodium: 140 mmol/L (ref 135–145)

## 2017-03-01 NOTE — Progress Notes (Signed)
[facility] Progress Note  3 Days Post-Op  Subjective: CC: No complaints Patient tolerated clear liquids. No n/v, abdominal pain improved. Walked 3x already this AM, had a BM. Pain well controlled.   Objective: Vital signs in last 24 hours: Temp:  [98.1 F (36.7 C)-98.7 F (37.1 C)] 98.2 F (36.8 C) (07/02 0621) Pulse Rate:  [73-90] 90 (07/02 0621) Resp:  [18-19] 18 (07/02 0621) BP: (126-150)/(63-73) 150/73 (07/02 0621) SpO2:  [90 %-98 %] 94 % (07/02 1002) Last BM Date: 02/24/17  Intake/Output from previous day: 07/01 0701 - 07/02 0700 In: 180 [P.O.:180] Out: 1195 [Urine:1150; Drains:45] Intake/Output this shift: Total I/O In: 270 [P.O.:270] Out: 10 [Drains:10]  PE: Gen:  Alert, NAD, pleasant Card:  Regular rate and rhythm, no M/G/R Pulm:  Normal effort, clear to auscultation bilaterally Abd: Soft, non-tender, mildly distended, bowel sounds present in all 4 quadrants, no HSM, incisions C/D/I Skin: warm and dry, no rashes  Psych: A&Ox3   Lab Results:   Recent Labs  02/28/17 0332 03/01/17 0625  WBC 9.7 7.8  HGB 8.3* 8.8*  HCT 28.6* 30.1*  PLT 224 245   BMET  Recent Labs  02/28/17 0332 03/01/17 0625  NA 135 140  K 3.4* 3.7  CL 103 109  CO2 27 25  GLUCOSE 103* 106*  BUN 15 10  CREATININE 1.49* 1.30*  CALCIUM 8.3* 8.4*   CMP     Component Value Date/Time   NA 140 03/01/2017 0625   NA 142 12/17/2016 1352   K 3.7 03/01/2017 0625   CL 109 03/01/2017 0625   CO2 25 03/01/2017 0625   GLUCOSE 106 (H) 03/01/2017 0625   BUN 10 03/01/2017 0625   BUN 21 12/17/2016 1352   CREATININE 1.30 (H) 03/01/2017 0625   CALCIUM 8.4 (L) 03/01/2017 0625   PROT 7.3 02/26/2017 0343   ALBUMIN 3.8 02/26/2017 0343   AST 26 02/26/2017 0343   ALT 22 02/26/2017 0343   ALKPHOS 101 02/26/2017 0343   BILITOT 0.9 02/26/2017 0343   GFRNONAA 54 (L) 03/01/2017 0625   GFRAA >60 03/01/2017 [id number]    Anti-infectives: Anti-infectives    Start     Dose/Rate Route  Frequency Ordered Stop   02/27/17 0200  piperacillin-tazobactam (ZOSYN) IVPB 3.375 g     3.375 g 12.5 mL/hr over 240 Minutes Intravenous Every 8 hours 02/26/17 1748     02/26/17 1800  piperacillin-tazobactam (ZOSYN) IVPB 3.375 g     3.375 g 12.5 mL/hr over 240 Minutes Intravenous  Once 02/26/17 1748 02/26/17 2206   02/26/17 0800  piperacillin-tazobactam (ZOSYN) IVPB 3.375 g  Status:  Discontinued     3.375 g 12.5 mL/hr over 240 Minutes Intravenous Every 8 hours 02/26/17 0022 02/26/17 1748   02/26/17 0030  piperacillin-tazobactam (ZOSYN) IVPB 3.375 g     3.375 g 100 mL/hr over 30 Minutes Intravenous  Once 02/26/17 0018 02/26/17 0117       Assessment/Plan HTN CKD PAD and nonobstructive CAD on recent catheterization ABL anemia - Hg 8.8, stable Hypokalemia - K 3.7, improved AKI on CKD-III - Cr 1.3, trending down with IVF  Gangrenous cholecystitis S/p lap chole 6/29 [place] - POD#3 - drain with 45cc output serosanguinous. Drain can be removed tomorrow - WBC WNL - tolerating clears, advance diet as tolerated  ID - zosyn 6/29>>day#4. Will need 5 total days of antibiotics. FEN - IVF, CLD VTE - SCDs, lovenox  Plan - advance diet as tolerated. Continue drain and IV antibiotics. Will likely be ready  for discharge tomorrow. F/u in [facility] office in 2-3 weeks.  LOS: 4 days    Wells Guiles , [facility] Surgery 03/01/2017, 11:18 AM Pager: [phone] Consults: [phone] Mon-Fri 7:00 am-4:30 pm Sat-Sun 7:00 am-11:30 am

## 2017-03-01 NOTE — Progress Notes (Signed)
Patient OOB ambulatory on unit, tolerated well, with portable O2 tank. Decreased SpO2 per monitor on room air; 77%.

## 2017-03-01 NOTE — Care Management Important Message (Signed)
Important Message  Patient Details  Name: Gwyndolyn KaufmanClyde Berzins MRN: [id number] Date of Birth: 06/09/1946   Medicare Important Message Given:  Yes    Brooklyne Radke Stefan ChurchBratton 03/01/2017, 3:18 PM

## 2017-03-01 NOTE — Progress Notes (Signed)
Patient OOB ambulatory for 1 full unit lap w/portable O2 tank, tolerated well.

## 2017-03-01 NOTE — Progress Notes (Signed)
PROGRESS NOTE [facility]   [place]   ZDG:[id number] DOB: August 17, 1946  DOA: 02/25/2017 PCP: Garner Gavel, MD   Brief Narrative:  71 y.o. Male with significant hx of HTN, HLD, PAD s/p fem-fem bypass and redone in 2016, stage III CKD, nonobstructive CAD w/ cath in April 2018, and GERD presented to the ED at OSH with RUQ pain that woke him from sleep. Described as severe, intermittent, sharp, associated N/V, no fever. At ED he was afrebile but tachycardic. ED labs: WBC 12, alk phos 146. All other labs unremarkable. RUQ US showed 2.5 cm obstructing stone in gallbladder neck with murphy's sign. Lap Chole done 6/29   Subjective: Patient seen and examined. Patient states he is feeling better. He has passed some gas which has made him feel better but he still feels bloated, he denies bowel movements. Patient is tolerating clear liquids. Patient denies N/V/D, chest pain, SOB. Patients abdomin is still distended. patien on 3.5 L o2 via [place], sat 98%.   Assessment & Plan:   Principal Problem:   Acute cholecystitis Active Problems:   PVD (peripheral vascular disease) (HCC)   HTN, goal below 130/80   CKD (chronic kidney disease), stage III  Acute Cholecystitis w/ stone obstruction in gallbladder neck on [place]: -S/p Lap Chole 6/29 -Drain - serosanuinous -Continue IV zosyn, on day 3 of 5 of abx -pain management as needed -Encourage ambulation  HTN -G1DD on recent echo and preserved EF w/o WMA's -BP elevated today at 150/73 - Continue Amlodipine -Restart Lopressor  AKI on stage III CKD -sCr was elevated post-surgery likely from hypoperfusion -Improved with IVF  PAD and nonobstructive CAD, recent catheterization -Asymptomatic -Continue ASA  Acute Hypoxia -likely from atelectasis post surgery, lowest O2 sat 87% -CXR showed no acute abnormalities -On 3.5 L O2 via [place], sat 98%.  -Wean O2 as tolerated, keep sat >91% -Continue incentive spirometry -OOB as  tolerated  Anemia -Multifactorial; CKD, Hemodilution, blood loss from surgery, Iron def. -Hgb stable -Start Iron prior to discharge    DVT prophylaxis: Lovenox Code Status: FULL Family Communication: None at bedside Disposition Plan: 24-48 hr.   Consultants:   Gen Surgery  Procedures:   Lap Chole 02/26/17  Antimicrobials:  Zosyn   Objective: Vitals:   02/28/17 1231 02/28/17 1716 02/28/17 2112 03/01/17 0621  BP: 126/69 138/63 126/63 (!) 150/73  Pulse: 86 73 74 90  Resp: 19 18 18 18   Temp: 98.7 F (37.1 C) 98.1 F (36.7 C) 98.2 F (36.8 C) 98.2 F (36.8 C)  TempSrc: Oral Oral Oral Oral  SpO2: 91% 91% 92% 98%  Weight:      Height:        Intake/Output Summary (Last 24 hours) at 03/01/17 0851 Last data filed at 03/01/17 0622  Gross per 24 hour  Intake              180 ml  Output             1195 ml  Net            -1015 ml   Filed Weights   02/25/17 1815 02/26/17 2021  Weight: 186 lb (84.4 kg) 181 lb (82.1 kg)    Examination:  General exam: Appears calm, comfortable, and in no acute distress, visibly distended abdomin. Respiratory system: Clear to auscultation. No wheezes,crackle or rhonchi Cardiovascular system: S1 & S2 heard, RRR. No JVD, murmurs, rubs or gallops Gastrointestinal system: Abdomen is distended, soft and TTP RUQ. Positive bowel sounds Central nervous  system: Alert and oriented. No focal neurological deficits. Extremities: No pedal edema. Skin: No rashes, lesions or ulcers Psychiatry: Judgement and insight appear normal. Mood & affect appropriate.    Data Reviewed: I have personally reviewed following labs and imaging studies  CBC:  Recent Labs Lab 02/26/17 0343 02/27/17 0230 02/28/17 0332 03/01/17 0625  WBC 16.8* 13.9* 9.7 7.8  NEUTROABS 14.7*  --  7.3  --   HGB 10.6* 8.6* 8.3* 8.8*  HCT 33.5* 30.0* 28.6* 30.1*  MCV 76.0* 78.5 79.0 78.6  PLT 368 225 224 676   Basic Metabolic Panel:  Recent Labs Lab 02/26/17 0343  02/27/17 0230 02/28/17 0332 03/01/17 0625  NA 138 137 135 140  K 4.5 4.0 3.4* 3.7  CL 107 106 103 109  CO2 22 25 27 25   GLUCOSE 135* 110* 103* 106*  BUN 21* 22* 15 10  CREATININE 1.49* 1.60* 1.49* 1.30*  CALCIUM 8.7* 8.1* 8.3* 8.4*   GFR: Estimated Creatinine Clearance: 58.9 mL/min (A) (by C-G formula based on SCr of 1.3 mg/dL (H)). Liver Function Tests:  Recent Labs Lab 02/26/17 0343  AST 26  ALT 22  ALKPHOS 101  BILITOT 0.9  PROT 7.3  ALBUMIN 3.8   No results for input(s): LIPASE, AMYLASE in the last 168 hours. No results for input(s): AMMONIA in the last 168 hours. Coagulation Profile:  Recent Labs Lab 02/26/17 0343  INR 1.13   Cardiac Enzymes: No results for input(s): CKTOTAL, CKMB, CKMBINDEX, TROPONINI in the last 168 hours. BNP (last 3 results) No results for input(s): PROBNP in the last 8760 hours. HbA1C: No results for input(s): HGBA1C in the last 72 hours. CBG: No results for input(s): GLUCAP in the last 168 hours. Lipid Profile: No results for input(s): CHOL, HDL, LDLCALC, TRIG, CHOLHDL, LDLDIRECT in the last 72 hours. Thyroid Function Tests: No results for input(s): TSH, T4TOTAL, FREET4, T3FREE, THYROIDAB in the last 72 hours. Anemia Panel:  Recent Labs  02/28/17 0332  VITAMINB12 409  FOLATE 21.7  FERRITIN 44  TIBC 318  IRON 12*  RETICCTPCT 0.9   Sepsis Labs: No results for input(s): PROCALCITON, LATICACIDVEN in the last 168 hours.  Recent Results (from the past 240 hour(s))  MRSA PCR Screening     Status: None   Collection Time: 02/26/17 12:05 PM  Result Value Ref Range Status   MRSA by PCR NEGATIVE NEGATIVE Final    Comment:        The GeneXpert MRSA Assay (FDA approved for NASAL specimens only), is one component of a comprehensive MRSA colonization surveillance program. It is not intended to diagnose MRSA infection nor to guide or monitor treatment for MRSA infections.          Radiology Studies: No results  found.    Scheduled Meds: . amLODipine  10 mg Oral Daily  . aspirin  81 mg Oral Daily  . enoxaparin (LOVENOX) injection  40 mg Subcutaneous Q24H  . pantoprazole  40 mg Oral Daily   Continuous Infusions: . lactated ringers 50 mL/hr at 03/01/17 0610  . piperacillin-tazobactam (ZOSYN)  IV 3.375 g (03/01/17 0204)     LOS: 4 days     Teal Raben, PA-s  If 7PM-7AM, please contact night-coverage [email or web address] Password [email or web address] 03/01/2017, 8:51 AM

## 2017-03-01 NOTE — Progress Notes (Signed)
Pharmacy Antibiotic Note  David KaufmanClyde Horne is a 71 y.o. male c/o RUQ pain admitted on 02/25/2017 with intra-abdominal infection.  Pharmacy has been consulted for zosyn dosing.  Zosyn for gangrenous cholecystitis - now s/p lap chole 6/29. Pt still with drain in place with 45cc output/24h.  POD #3. Afebrile, WBC wnl. Surgery plans for 5 days of therapy.  Plan: Continue Zosyn 3.375 gm IV q8h (4 hour infusion)  Monitor clinical picture, renal function  Stop date planned for tomorrow  Height: 6\' 1"  (185.4 cm) Weight: 181 lb (82.1 kg) IBW/kg (Calculated) : 79.9  Temp (24hrs), Avg:98.2 F (36.8 C), Min:98.1 F (36.7 C), Max:98.2 F (36.8 C)   Recent Labs Lab 02/26/17 0343 02/27/17 0230 02/28/17 0332 03/01/17 0625  WBC 16.8* 13.9* 9.7 7.8  CREATININE 1.49* 1.60* 1.49* 1.30*    Estimated Creatinine Clearance: 58.9 mL/min (A) (by C-G formula based on SCr of 1.3 mg/dL (H)).    No Known Allergies   Thank you for allowing pharmacy to be a part of this patient's care.  David Horne,David Horne 03/01/2017 12:49 PM

## 2017-03-02 ENCOUNTER — Inpatient Hospital Stay (HOSPITAL_COMMUNITY): Payer: Medicare Other

## 2017-03-02 DIAGNOSIS — R0902 Hypoxemia: Secondary | ICD-10-CM

## 2017-03-02 LAB — CBC WITH DIFFERENTIAL/PLATELET
BASOS ABS: 0 10*3/uL (ref 0.0–0.1)
Basophils Relative: 1 %
Eosinophils Absolute: 0.5 10*3/uL (ref 0.0–0.7)
Eosinophils Relative: 7 %
HCT: 29.7 % — ABNORMAL LOW (ref 39.0–52.0)
HEMOGLOBIN: 8.5 g/dL — AB (ref 13.0–17.0)
LYMPHS PCT: 22 %
Lymphs Abs: 1.5 10*3/uL (ref 0.7–4.0)
MCH: 22.5 pg — ABNORMAL LOW (ref 26.0–34.0)
MCHC: 28.6 g/dL — ABNORMAL LOW (ref 30.0–36.0)
MCV: 78.6 fL (ref 78.0–100.0)
Monocytes Absolute: 0.6 10*3/uL (ref 0.1–1.0)
Monocytes Relative: 9 %
NEUTROS ABS: 4.3 10*3/uL (ref 1.7–7.7)
NEUTROS PCT: 61 %
Platelets: 256 10*3/uL (ref 150–400)
RBC: 3.78 MIL/uL — AB (ref 4.22–5.81)
RDW: 15.1 % (ref 11.5–15.5)
WBC: 6.9 10*3/uL (ref 4.0–10.5)

## 2017-03-02 MED ORDER — SODIUM CHLORIDE 0.9 % IV SOLN
125.0000 mg | Freq: Once | INTRAVENOUS | Status: AC
  Administered 2017-03-02: 125 mg via INTRAVENOUS
  Filled 2017-03-02: qty 10

## 2017-03-02 MED ORDER — TECHNETIUM TO 99M ALBUMIN AGGREGATED
4.0000 | Freq: Once | INTRAVENOUS | Status: AC | PRN
  Administered 2017-03-02: 4 via INTRAVENOUS

## 2017-03-02 MED ORDER — TECHNETIUM TC 99M DIETHYLENETRIAME-PENTAACETIC ACID: 30.2000 | Freq: Once | INTRAVENOUS | Status: DC | PRN

## 2017-03-02 MED ORDER — FERROUS SULFATE 325 (65 FE) MG PO TABS
325.0000 mg | ORAL_TABLET | Freq: Three times a day (TID) | ORAL | Status: DC
  Administered 2017-03-03: 325 mg via ORAL
  Filled 2017-03-02: qty 1

## 2017-03-02 MED ORDER — SALINE SPRAY 0.65 % NA SOLN
1.0000 | NASAL | Status: DC | PRN
  Filled 2017-03-02: qty 44

## 2017-03-02 NOTE — Evaluation (Signed)
Physical Therapy Evaluation Patient Details Name: David Horne MRN: [id number] DOB: 12/26/1945 Today's Date: 03/02/2017   History of Present Illness  Pt is a 71 y/o male admitted secondary to acute cholecystitis s/p laparascopic cholecystectomy on 6/29.   Clinical Impression  Pt s/p surgery above with deficits below. PTA pt was independent with mobility and was still driving and riding motorcycles. Upon evaluation, pt limited by decreased strength and oxygen sats with mobility. Pt's oxygen decreasing to 85% on RA, and required 2L to return to 91-92% throughout rest of ambulation. Pt overall steady with mobility requiring supervision for safety. Will need to perform stair training in next session to ensure safety with stair navigation. Pt reports he lives alone, however, his son and daughter in law live next door and can check on him as needed. No follow up recommendations. Will continue to follow acutely to ensure safety with mobility.     Follow Up Recommendations No PT follow up;Supervision - Intermittent    Equipment Recommendations  None recommended by PT    Recommendations for Other Services       Precautions / Restrictions Precautions Precautions: Other (comment) Precaution Comments: Watch oxygen sats  Restrictions Weight Bearing Restrictions: No      Mobility  Bed Mobility               General bed mobility comments: In chair upon entry   Transfers Overall transfer level: Needs assistance Equipment used: None Transfers: Sit to/from Stand Sit to Stand: Supervision         General transfer comment: Supervision for safety   Ambulation/Gait Ambulation/Gait assistance: Supervision;Min guard Ambulation Distance (Feet): 350 Feet Assistive device: None Gait Pattern/deviations: Step-through pattern;Decreased stride length Gait velocity: WNL Gait velocity interpretation: at or above normal speed for age/gender General Gait Details: Overall steady gait with good gait  speed. Pt oxygen sats dropping to 85% on RA during ambulation. 2L of oxygen applied and oxygen sats remained at 91-92% on 2L.   Stairs            Wheelchair Mobility    Modified Rankin (Stroke Patients Only)       Balance Overall balance assessment: Needs assistance Sitting-balance support: No upper extremity supported;Feet supported Sitting balance-Leahy Scale: Good     Standing balance support: No upper extremity supported;During functional activity Standing balance-Leahy Scale: Fair                               Pertinent Vitals/Pain Pain Assessment: 0-10 Pain Score: 3  Pain Location: abdomen  Pain Descriptors / Indicators: Operative site guarding;Sore Pain Intervention(s): Limited activity within patient's tolerance;Monitored during session;Repositioned    Home Living Family/patient expects to be discharged to:: Private residence Living Arrangements: Alone Available Help at Discharge: Family;Available 24 hours/day (Son and daughter in law live next door ) Type of Home: House Home Access: Stairs to enter Entrance Stairs-Rails: Right;Left;Can reach both Secretary/administratorntrance Stairs-Number of Steps: 6 Home Layout: One level Home Equipment: Gilmer MorCane - single point      Prior Function Level of Independence: Independent         Comments: Reports he was pretty active until March, however, has been slowed down. Still riding motorcycles and driving      Hand Dominance   Dominant Hand: Right    Extremity/Trunk Assessment   Upper Extremity Assessment Upper Extremity Assessment: Overall WFL for tasks assessed    Lower Extremity Assessment Lower Extremity Assessment: Generalized weakness (  Grossly 4/5 throughout )    Cervical / Trunk Assessment Cervical / Trunk Assessment: Normal  Communication   Communication: No difficulties  Cognition Arousal/Alertness: Awake/alert Behavior During Therapy: WFL for tasks assessed/performed Overall Cognitive Status: Within  Functional Limits for tasks assessed                                        General Comments General comments (skin integrity, edema, etc.): Oxygen sats decreased during ambulation. Upon retun to room; pt's oxygen sats at 93-94% on 2L. Decreased oxygen to RA after session and sats maintained at 93-94%.    Exercises     Assessment/Plan    PT Assessment Patient needs continued PT services  PT Problem List Pain;Cardiopulmonary status limiting activity;Decreased strength;Decreased activity tolerance;Decreased knowledge of precautions       PT Treatment Interventions DME instruction;Gait training;Stair training;Functional mobility training;Therapeutic activities;Therapeutic exercise;Balance training;Neuromuscular re-education;Patient/family education    PT Goals (Current goals can be found in the Care Plan section)  Acute Rehab PT Goals Patient Stated Goal: to go home PT Goal Formulation: With patient Time For Goal Achievement: 03/09/17 Potential to Achieve Goals: Good    Frequency Min 3X/week   Barriers to discharge        Co-evaluation               AM-PAC PT "6 Clicks" Daily Activity  Outcome Measure Difficulty turning over in bed (including adjusting bedclothes, sheets and blankets)?: A Little Difficulty moving from lying on back to sitting on the side of the bed? : A Little Difficulty sitting down on and standing up from a chair with arms (e.g., wheelchair, bedside commode, etc,.)?: A Little Help needed moving to and from a bed to chair (including a wheelchair)?: A Little Help needed walking in hospital room?: A Little Help needed climbing 3-5 steps with a railing? : A Little 6 Click Score: 18    End of Session Equipment Utilized During Treatment: Gait belt;Oxygen Activity Tolerance: Other (comment);Patient tolerated treatment well (decreased O2 sats on RA) Patient left: in chair;with call bell/phone within reach;with family/visitor present Nurse  Communication: Mobility status PT Visit Diagnosis: Other abnormalities of gait and mobility (R26.89)    Time: 2956-2130 PT Time Calculation (min) (ACUTE ONLY): 22 min   Charges:   PT Evaluation $PT Eval Low Complexity: 1 Procedure PT Treatments $Gait Training: 8-22 mins   PT G Codes:        Gladys Damme, PT, DPT  Acute Rehabilitation Services  Pager: [phone]   Lehman Prom 03/02/2017, 12:41 PM

## 2017-03-02 NOTE — Progress Notes (Signed)
PROGRESS NOTE [facility]   Wells   YFV:[id number] DOB: 1945/11/22  DOA: 02/25/2017 PCP: Garner Gavel, MD   Brief Narrative:  71 y.o. Male with significant hx of HTN, HLD, PAD s/p fem-fem bypass and redone in 2016, stage III CKD, nonobstructive CAD w/ cath in April 2018, and GERD presented to the ED at OSH with RUQ pain that woke him from sleep. Described as severe, intermittent, sharp, associated N/V, no fever. At ED he was afrebile but tachycardic. ED labs: WBC 12, alk phos 146. All other labs unremarkable. RUQ US showed 2.5 cm obstructing stone in gallbladder neck with murphy's sign. Lap Chole done 6/29. Patient is feeling better today. O2 on roomair was 89-84% during exam.    Subjective: Patient seen and examined. Patient is feeling better today. He has passed gas and had multiple bowel movements. Last bowel movement was seen by the nurse and myself and appeared blood tinged red. Patient admits history of hemorrhoids. Labs have been stable, no active bleed suspected. Patient admits random bouts of SOB and cough. Patient denies chest pain, N/V, Difficulty breathing.  Assessment & Plan:   Principal Problem:   Acute cholecystitis Active Problems:   PVD (peripheral vascular disease) (HCC)   HTN, goal below 130/80   CKD (chronic kidney disease), stage III   SOB (shortness of breath)  Acute cholecystitis w/ stone obstruction in gallbladder neck on [place] -S/p Lap Chole 6/29 -Drain serosanguinous -Continue IV Zosyn, on day 4 of 5 -Pain management prn -PT for ambulation  HTN -G1DD on last echo w/ preserved EF -Controlled BP -Continue Amlodipine and Lopressor  Acute Hypoxia -89% on room air during exam -Repeat CXR -Continue incentive spirometry -O2 sat while ambulating on roomair dropped to 85%, per PT -VQ scan to rule out PE  AKI on stage III CKD -sCr was elevated post-surgery likely from hypoperfusion -Normalized now, Cr 1.3  PAD and nonobstructive CAD, recent  Cath -Asymptomatic, continue ASA  Anemia -Multifactorial; CKD, Hemodilution, blood loss from surgery, Iron def. -Hgb stable -blood tinged bowel movement today, likely due to hemorrhoids, no acute bleed suspected -start iron on discharge     DVT prophylaxis: Lovenox Code Status: FULL Family Communication: None at bedside Disposition Plan: 24-48 hr.   Consultants:   Gen Surgery  Procedures:   Lap Chole 02/26/17  Antimicrobials:  Zosyn   Objective: Vitals:   03/01/17 1700 03/01/17 1701 03/01/17 2022 03/02/17 0614  BP:   137/65 140/66  Pulse:   93 84  Resp:   18 16  Temp:   98.5 F (36.9 C) 98.2 F (36.8 C)  TempSrc:   Oral Oral  SpO2: (!) 88% 95% 94% 95%  Weight:      Height:        Intake/Output Summary (Last 24 hours) at 03/02/17 1107 Last data filed at 03/02/17 0832  Gross per 24 hour  Intake              950 ml  Output             1915 ml  Net             -965 ml   Filed Weights   02/25/17 1815 02/26/17 2021  Weight: 186 lb (84.4 kg) 181 lb (82.1 kg)    Examination:  General exam: Appears calm and comfortable, in no acute distress Respiratory system: mild rales and diminished breath sound right lower lobe. Cardiovascular system: S1 & S2 heard, RRR. No JVD, murmurs, rubs or  gallops Gastrointestinal system: Abdomen is distended, soft and mildly tender. No organomegaly or masses felt. Normal bowel sounds heard. Central nervous system: Alert and oriented. No focal neurological deficits. Extremities: No pedal edema. Symmetric, strength 5/5   Skin: No rashes, lesions or ulcers Psychiatry: Judgement and insight appear normal. Mood & affect appropriate.    Data Reviewed: I have personally reviewed following labs and imaging studies  CBC:  Recent Labs Lab 02/26/17 0343 02/27/17 0230 02/28/17 0332 03/01/17 0625 03/02/17 0448  WBC 16.8* 13.9* 9.7 7.8 6.9  NEUTROABS 14.7*  --  7.3  --  4.3  HGB 10.6* 8.6* 8.3* 8.8* 8.5*  HCT 33.5* 30.0*  28.6* 30.1* 29.7*  MCV 76.0* 78.5 79.0 78.6 78.6  PLT 368 225 224 245 229   Basic Metabolic Panel:  Recent Labs Lab 02/26/17 0343 02/27/17 0230 02/28/17 0332 03/01/17 0625  NA 138 137 135 140  K 4.5 4.0 3.4* 3.7  CL 107 106 103 109  CO2 22 25 27 25   GLUCOSE 135* 110* 103* 106*  BUN 21* 22* 15 10  CREATININE 1.49* 1.60* 1.49* 1.30*  CALCIUM 8.7* 8.1* 8.3* 8.4*   GFR: Estimated Creatinine Clearance: 58.9 mL/min (A) (by C-G formula based on SCr of 1.3 mg/dL (H)). Liver Function Tests:  Recent Labs Lab 02/26/17 0343  AST 26  ALT 22  ALKPHOS 101  BILITOT 0.9  PROT 7.3  ALBUMIN 3.8   No results for input(s): LIPASE, AMYLASE in the last 168 hours. No results for input(s): AMMONIA in the last 168 hours. Coagulation Profile:  Recent Labs Lab 02/26/17 0343  INR 1.13   Cardiac Enzymes: No results for input(s): CKTOTAL, CKMB, CKMBINDEX, TROPONINI in the last 168 hours. BNP (last 3 results) No results for input(s): PROBNP in the last 8760 hours. HbA1C: No results for input(s): HGBA1C in the last 72 hours. CBG: No results for input(s): GLUCAP in the last 168 hours. Lipid Profile: No results for input(s): CHOL, HDL, LDLCALC, TRIG, CHOLHDL, LDLDIRECT in the last 72 hours. Thyroid Function Tests: No results for input(s): TSH, T4TOTAL, FREET4, T3FREE, THYROIDAB in the last 72 hours. Anemia Panel:  Recent Labs  02/28/17 0332  VITAMINB12 409  FOLATE 21.7  FERRITIN 44  TIBC 318  IRON 12*  RETICCTPCT 0.9   Sepsis Labs: No results for input(s): PROCALCITON, LATICACIDVEN in the last 168 hours.  Recent Results (from the past 240 hour(s))  MRSA PCR Screening     Status: None   Collection Time: 02/26/17 12:05 PM  Result Value Ref Range Status   MRSA by PCR NEGATIVE NEGATIVE Final    Comment:        The GeneXpert MRSA Assay (FDA approved for NASAL specimens only), is one component of a comprehensive MRSA colonization surveillance program. It is not intended to  diagnose MRSA infection nor to guide or monitor treatment for MRSA infections.          Radiology Studies: Dg Chest Port 1 View  Result Date: 03/02/2017 CLINICAL DATA:  Recent cholecystectomy with hypoxia EXAM: PORTABLE CHEST 1 VIEW COMPARISON:  02/27/2017 FINDINGS: Cardiac shadow is stable. The lungs are well aerated bilaterally. No acute bony abnormality is seen. IMPRESSION: No active disease. Electronically Signed   By: Inez Catalina M.D.   On: 03/02/2017 10:11      Scheduled Meds: . amLODipine  10 mg Oral Daily  . aspirin  81 mg Oral Daily  . enoxaparin (LOVENOX) injection  40 mg Subcutaneous Q24H  . pantoprazole  40 mg  Oral Daily   Continuous Infusions: . ferric gluconate (FERRLECIT/NULECIT) IV    . piperacillin-tazobactam (ZOSYN)  IV 3.375 g (03/02/17 0940)     LOS: 5 days    Zannie Locastro, PA-s  If 7PM-7AM, please contact night-coverage [email or web address] Password [email or web address] 03/02/2017, 11:07 AM

## 2017-03-02 NOTE — Progress Notes (Signed)
SATURATION QUALIFICATIONS: (This note is used to comply with regulatory documentation for home oxygen)  Patient Saturations on Room Air at Rest = 92%  Patient Saturations on Room Air while Ambulating = 85%  Patient Saturations on 2 Liters of oxygen while Ambulating = 92%  Please briefly explain why patient needs home oxygen: Oxygen sats decreased during ambulation. Upon retun to room; pt's oxygen sats at 93-94% on 2L. Decreased oxygen to RA after session and sats maintained at 93-94%.  Gladys DammeBrittany Noe Goyer, PT, DPT  Acute Rehabilitation Services  Pager: [phone]

## 2017-03-02 NOTE — Progress Notes (Signed)
[facility] Progress Note  4 Days Post-Op  Subjective:  CC: Abdominal pain, 2/10  Mild pain. No nausea or vomiting. Having flatus. Tolerating diet. No CP or SOB. Pt quit smoking 15 years ago. 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 Had a "breathing test" done a few months back and was told nothing was wrong. No new complaints.   Pt O2 sats dropped to 89% ORA today. Medical team ordered CXR and VQ scan.   Objective: Vital signs in last 24 hours: Temp:  [98 F (36.7 C)-98.5 F (36.9 C)] 98.2 F (36.8 C) (07/03 0614) Pulse Rate:  [78-93] 84 (07/03 0614) Resp:  [16-18] 16 (07/03 0614) BP: (127-140)/(65-70) 140/66 (07/03 0614) SpO2:  [88 %-95 %] 95 % (07/03 0614) Last BM Date: 03/02/17  Intake/Output from previous day: 07/02 0701 - 07/03 0700 In: 1220 [P.O.:1070; IV Piggyback:150] Out: 1575 [Urine:1550; Drains:25] Intake/Output this shift: Total I/O In: 220 [P.O.:220] Out: 350 [Urine:350]  PE: Gen:  Alert, NAD, pleasant, well appearing Card:  Regular rate and rhythm, no murmur appreciated Pulm:  Rate and effort normal, clear to auscultation bilaterally, room air and sats between 88-97 while I was in the room Abd: Soft, not distended, +BS, incisions C/D/I, mild generalized TTP worse on the right side of abdomen Skin: warm and dry, no rashes  Psych: A&Ox3   Lab Results:   Recent Labs  03/01/17 0625 03/02/17 0448  WBC 7.8 6.9  HGB 8.8* 8.5*  HCT 30.1* 29.7*  PLT 245 256   BMET  Recent Labs  02/28/17 0332 03/01/17 0625  NA 135 140  K 3.4* 3.7  CL 103 109  CO2 27 25  GLUCOSE 103* 106*  BUN 15 10  CREATININE 1.49* 1.30*  CALCIUM 8.3* 8.4*   CMP     Component Value Date/Time   NA 140 03/01/2017 0625   NA 142 12/17/2016 1352   K 3.7 03/01/2017 0625   CL 109 03/01/2017 0625   CO2 25 03/01/2017 0625   GLUCOSE 106 (H) 03/01/2017 0625   BUN 10 03/01/2017 0625   BUN 21 12/17/2016 1352   CREATININE 1.30 (H) 03/01/2017 0625   CALCIUM 8.4 (L)  03/01/2017 0625   PROT 7.3 02/26/2017 0343   ALBUMIN 3.8 02/26/2017 0343   AST 26 02/26/2017 0343   ALT 22 02/26/2017 0343   ALKPHOS 101 02/26/2017 0343   BILITOT 0.9 02/26/2017 0343   GFRNONAA 54 (L) 03/01/2017 0625   GFRAA >60 03/01/2017 0625   Lipase  No results found for: LIPASE  Studies/Results: Dg Chest Port 1 View  Result Date: 03/02/2017 CLINICAL DATA:  Recent cholecystectomy with hypoxia EXAM: PORTABLE CHEST 1 VIEW COMPARISON:  02/27/2017 FINDINGS: Cardiac shadow is stable. The lungs are well aerated bilaterally. No acute bony abnormality is seen. IMPRESSION: No active disease. Electronically Signed   By: Alcide CleverMark  Lukens M.D.   On: 03/02/2017 10:11   Anti-infectives: Anti-infectives    Start     Dose/Rate Route Frequency Ordered Stop   02/27/17 0200  piperacillin-tazobactam (ZOSYN) IVPB 3.375 g     3.375 g 12.5 mL/hr over 240 Minutes Intravenous Every 8 hours 02/26/17 1748 03/02/17 2359   02/26/17 1800  piperacillin-tazobactam (ZOSYN) IVPB 3.375 g     3.375 g 12.5 mL/hr over 240 Minutes Intravenous  Once 02/26/17 1748 02/26/17 2206   02/26/17 0800  piperacillin-tazobactam (ZOSYN) IVPB 3.375 g  Status:  Discontinued     3.375 g 12.5 mL/hr over 240 Minutes Intravenous Every 8 hours 02/26/17 0022  02/26/17 1748   02/26/17 0030  piperacillin-tazobactam (ZOSYN) IVPB 3.375 g     3.375 g 100 mL/hr over 30 Minutes Intravenous  Once 02/26/17 0018 02/26/17 0117     Assessment/Plan HTN CKD PAD and nonobstructive CAD on recent catheterization ABL anemia - Hg 8.5, stable Hypokalemia - K 3.7, improved AKI on CKD-III - Cr 1.3, trending down with IVF  Gangrenous cholecystitis S/p lap chole 6/29 [place] - POD#4 - drain with 25cc output serosanguinous. D/C drain - WBC WNL  ID - zosyn 6/29>>day#5/5 FEN - IVF, heart healthy diet VTE - SCDs, lovenox  Plan - hypoxia workup per medicine. Will follow up CXR and VQ scan. Continue IS. Stable for discharge from surgical  perspective. Follow-up provided. D/C JP. We will continue to follow patient until his discharge.   LOS: 5 days    Adam Phenix , [facility] 03/02/2017, 11:48 AM Pager: [phone] Consults: [phone] Mon-Fri 7:00 am-4:30 pm Sat-Sun 7:00 am-11:30 am

## 2017-03-03 DIAGNOSIS — R0902 Hypoxemia: Secondary | ICD-10-CM

## 2017-03-03 DIAGNOSIS — I10 Essential (primary) hypertension: Secondary | ICD-10-CM

## 2017-03-03 DIAGNOSIS — I739 Peripheral vascular disease, unspecified: Secondary | ICD-10-CM

## 2017-03-03 DIAGNOSIS — N183 Chronic kidney disease, stage 3 (moderate): Secondary | ICD-10-CM

## 2017-03-03 DIAGNOSIS — K810 Acute cholecystitis: Secondary | ICD-10-CM

## 2017-03-03 MED ORDER — ONDANSETRON 4 MG PO TBDP: 4.0000 mg | ORAL_TABLET | Freq: Three times a day (TID) | ORAL | 0 refills | Status: DC | PRN

## 2017-03-03 MED ORDER — ACETAMINOPHEN 500 MG PO TABS: 500.0000 mg | ORAL_TABLET | Freq: Four times a day (QID) | ORAL | 0 refills | Status: AC | PRN

## 2017-03-03 NOTE — Progress Notes (Signed)
5 Days Post-Op  Subjective: The patient is doing very well. Tolerating regular diet.  Voiding without difficulty.  Having bowel movements Ambulating in the halls without oxygen.  O2 sats look good. VQ scan low probability for PE.  Chest x-ray unremarkable  From surgical standpoint he may go home Diet and activities discussed Follow-up arranged, July 12 No narcotics  objective: Vital signs in last 24 hours: Temp:  [98 F (36.7 C)-98.7 F (37.1 C)] 98.5 F (36.9 C) (07/04 0606) Pulse Rate:  [79-87] 80 (07/04 0606) Resp:  [16-17] 17 (07/04 0606) BP: (142-150)/(65-75) 148/70 (07/04 0606) SpO2:  [90 %-97 %] 90 % (07/04 0606) Last BM Date: 03/01/17  Intake/Output from previous day: 07/03 0701 - 07/04 0700 In: 700 [P.O.:700] Out: 1375 [Urine:1350; Drains:25] Intake/Output this shift: No intake/output data recorded.  General appearance: Alert.  Ambulating briskly in hall without difficulty.  Color good Resp: clear to auscultation bilaterally GI: Soft and nontender.  Trocar sites healing uneventfully  Lab Results:  No results found for this or any previous visit (from the past 24 hour(s)).   Studies/Results: Nm Pulmonary Perf And Vent  Result Date: 03/02/2017 CLINICAL DATA:  Hypoxia EXAM: NUCLEAR MEDICINE VENTILATION - PERFUSION LUNG SCAN VIEWS: Anterior, posterior, left lateral, right lateral, RPO, LPO, RAO, LAO -ventilation and perfusion RADIOPHARMACEUTICALS:  30.2 mCi Technetium-82m DTPA aerosol inhalation and 4.1 mCi Technetium-24m MAA IV COMPARISON:  Ventilation-perfusion lung scan examinations December 03, 2016; chest radiograph March 02, 2017 FINDINGS: Ventilation: There is decreased uptake in portions of both upper lobes, stable from study 3 months prior. No new ventilation defects are identified. Perfusion: There are areas of decreased profusion primarily in the upper lobes which match ventilation defects and are stable compared to study from 3 months prior. There is no segmental  perfusion defect. There is no new perfusion defect compared to 3 months prior. IMPRESSION: Stable appearance of the ventilation and profusion examination is compared to 3 months prior. Matching primarily upper lobe ventilation and perfusion defects in a nonsegmental distribution. This overall appearance is indicative of a low probability of pulmonary embolus. Electronically Signed   By: Bretta Bang III M.D.   On: 03/02/2017 13:51   Dg Chest Port 1 View  Result Date: 03/02/2017 CLINICAL DATA:  Recent cholecystectomy with hypoxia EXAM: PORTABLE CHEST 1 VIEW COMPARISON:  02/27/2017 FINDINGS: Cardiac shadow is stable. The lungs are well aerated bilaterally. No acute bony abnormality is seen. IMPRESSION: No active disease. Electronically Signed   By: Alcide Clever M.D.   On: 03/02/2017 10:11    . amLODipine  10 mg Oral Daily  . aspirin  81 mg Oral Daily  . enoxaparin (LOVENOX) injection  40 mg Subcutaneous Q24H  . ferrous sulfate  325 mg Oral TID WC  . pantoprazole  40 mg Oral Daily     Assessment/Plan: s/p Procedure(s): LAPAROSCOPIC CHOLECYSTECTOMY  HTN CKD PAD and nonobstructive CAD on recent catheterization ABL anemia - Hg 8.5, stable Hypokalemia - K 3.7, improved AKI on CKD-III - Cr 1.3,trending down with IVF  Gangrenous cholecystitis S/p lap chole 6/29 [place] - POD#5 - WBC WNL  ID - zosyn 6/29>>day#5/5 FEN - IVF, heart healthy diet VTE - SCDs, lovenox  Plan  - from a surgical standpoint he meets all criteria for discharge -Diet and activities discussed.  Low-fat diet.  No sports or heavy lifting for 3 weeks -No narcotics.  He will take Advil or Tylenol for pain -He has been scheduled to see [place] in the office at 2:45  PM on July 12 -Discharge instructions and follow-up appointment bloated into discharge provider navigator We will sign off             @PROBHOSP @  LOS: 6 days    Tavin Vernet M 03/03/2017  . .prob

## 2017-03-03 NOTE — Progress Notes (Signed)
Physical Therapy Treatment Patient Details Name: David Horne MRN: [id number] DOB: July 05, 1946 Today's Date: 03/03/2017    History of Present Illness Pt is a 71 y/o male admitted secondary to acute cholecystitis s/p laparascopic cholecystectomy on 6/29.     PT Comments    Pt performed increased activity and progressed to stair training.  Pt performed with shoes on and no safety concerns noted with mobility.  Pt however did require education on pacing and rest breaks.  O2 sats on RA 93% but DOE remains.  RN in room to d/c patient after stair and gait training.  Pt anxious to d/c home.    Follow Up Recommendations  No PT follow up;Supervision - Intermittent     Equipment Recommendations  None recommended by PT    Recommendations for Other Services       Precautions / Restrictions Precautions Precautions: Other (comment) Precaution Comments: Watch oxygen sats  Restrictions Weight Bearing Restrictions: No    Mobility  Bed Mobility               General bed mobility comments: In chair upon entry   Transfers Overall transfer level: Modified independent Equipment used: None Transfers: Sit to/from Stand Sit to Stand: Modified independent (Device/Increase time)         General transfer comment: Good technique no assistance need to and from seated surface.    Ambulation/Gait Ambulation/Gait assistance: Supervision Ambulation Distance (Feet): 200 Feet Assistive device: None Gait Pattern/deviations: Step-to pattern Gait velocity: WNL Gait velocity interpretation: at or above normal speed for age/gender General Gait Details: Pt remains steady with gait training, required cues for pacing.  O2 sats improved during session today.  Pt 93% on RA after ambulation and stair training. Pt educated to continue ambulation at home to promote healing.     Stairs Stairs: Yes  Assist: Supervision Stair Management: One rail Left;One rail Right Number of Stairs: 12 General stair  comments: R rail ascending and L rail descending.  Pt required cues for safety to placed entire foot on stairs to improve safety.    Wheelchair Mobility    Modified Rankin (Stroke Patients Only)       Balance Overall balance assessment: Needs assistance Sitting-balance support: No upper extremity supported;Feet supported Sitting balance-Leahy Scale: Good     Standing balance support: No upper extremity supported;During functional activity Standing balance-Leahy Scale: Fair                              Cognition Arousal/Alertness: Awake/alert Behavior During Therapy: WFL for tasks assessed/performed Overall Cognitive Status: Within Functional Limits for tasks assessed                                        Exercises      General Comments        Pertinent Vitals/Pain Pain Assessment: 0-10 Pain Score: 0-No pain    Home Living                      Prior Function            PT Goals (current goals can now be found in the care plan section) Acute Rehab PT Goals Patient Stated Goal: to go home Potential to Achieve Goals: Good Progress towards PT goals: Progressing toward goals    Frequency    Min 3X/week  PT Plan Current plan remains appropriate    Co-evaluation              AM-PAC PT "6 Clicks" Daily Activity  Outcome Measure  Difficulty turning over in bed (including adjusting bedclothes, sheets and blankets)?: None Difficulty moving from lying on back to sitting on the side of the bed? : None Difficulty sitting down on and standing up from a chair with arms (e.g., wheelchair, bedside commode, etc,.)?: None Help needed moving to and from a bed to chair (including a wheelchair)?: None Help needed walking in hospital room?: None Help needed climbing 3-5 steps with a railing? : None 6 Click Score: 24    End of Session Equipment Utilized During Treatment: Gait belt Activity Tolerance: Patient tolerated  treatment well (DOE noted but O2 sats remains 93% on RA.) Patient left: in chair;with call bell/phone within reach;with family/visitor present Nurse Communication: Mobility status PT Visit Diagnosis: Other abnormalities of gait and mobility (R26.89)     Time: [id number] PT Time Calculation (min) (ACUTE ONLY): 10 min  Charges:  $Gait Training: 8-22 mins                    G Codes:       Joycelyn RuaAimee Shawntina Diffee, PTA pager [phone]    Florestine AversAimee J Ramla Hase 03/03/2017, 12:42 PM

## 2017-03-03 NOTE — Progress Notes (Signed)
Discharge home. Home discharge instruction given, no question verbalized. 

## 2017-03-03 NOTE — Care Management Important Message (Signed)
Important Message  Patient Details  Name: David Horne MRN: [id number] Date of Birth: 02/27/1946   Medicare Important Message Given:  Yes    Carr Shartzer Abena 03/03/2017, 10:16 AM

## 2017-03-03 NOTE — Progress Notes (Signed)
Pt. has not worn Bipap since 6/29, found on room air with no c/o.

## 2017-03-03 NOTE — Discharge Summary (Signed)
Physician Discharge Summary   Patient ID: David Horne MRN: [id number] DOB/AGE: 07-Jun-1946 71 y.o.  Admit date: 02/25/2017 Discharge date: 03/03/2017  Primary Care Physician:  Avis Epley, MD  Discharge Diagnoses:     Acute cholecystitis with choledocholithiasis, stone obstruction in gallbladder neck  . PVD (peripheral vascular disease) (HCC) . HTN, goal below 130/80 . CKD (chronic kidney disease), stage III    Acute respiratory failure with hypoxia   PAD   Anemia   Consults:  Gen. surgery  Recommendations for Outpatient Follow-up:  Please repeat CBC/BMET at next visit  Low-fat diet     Allergies:  No Known Allergies   DISCHARGE MEDICATIONS: Current Discharge Medication List    START taking these medications   Details  acetaminophen (TYLENOL) 500 MG tablet Take 1 tablet (500 mg total) by mouth every 6 (six) hours as needed for moderate pain or fever. Qty: 30 tablet, Refills: 0    ondansetron (ZOFRAN ODT) 4 MG disintegrating tablet Take 1 tablet (4 mg total) by mouth every 8 (eight) hours as needed for nausea or vomiting. Qty: 20 tablet, Refills: 0      CONTINUE these medications which have NOT CHANGED   Details  amLODipine (NORVASC) 10 MG tablet Take 10 mg by mouth daily.    ascorbic acid (VITAMIN C) 1000 MG tablet Take 1,000 mg by mouth daily.    aspirin 81 MG tablet Take 81 mg by mouth daily.    losartan (COZAAR) 100 MG tablet Take 100 mg by mouth daily.     Multiple Vitamins-Minerals (CENTRUM SILVER PO) Take 1 tablet by mouth daily.     Omega-3 Fatty Acids (FISH OIL) 1000 MG CAPS Take 2,000 mg by mouth 2 (two) times daily.     omeprazole (PRILOSEC) 20 MG capsule Take 20 mg by mouth daily.         Brief H and P: For complete details please refer to admission H and P, but in brief 71 year old male with significant medical history of hypertension, hyperlipidemia, chronic kidney disease, presented to the ED complaining of abdominal pain, right upper  quadrant ultrasound showed a 2.5 cm obstructing stone in the gallbladder neck with Murphy's sign positive. Laparoscopic cholecystectomy was done on 6/29. Patient after surgery required BiPAP and was placed in stepdown, now weaned off oxygen.  Hospital Course:     Acute cholecystitis - Right upper quadrant ultrasound showed 2.5 cm obstructing stone in the gallbladder neck with Murphy's sign positive. - With stone obstruction, Gen. surgery was consulted, patient underwent laparoscopic cholecystectomy on 6/29 - Drain has been discontinued, patient has completed IV Zosyn - He is tolerating solid diet without any difficulty - Patient has been cleared by general surgery for discharge, he will be followed outpatient on July 12. - He was recommended no narcotics and low fat diet, no sports or heavy lifting for 3 weeks.  Active Problems: Hypoxia with acute respiratory failure - Postoperatively, patient had hypoxia, was placed on BiPAP - VQ scan showed low probability for PE, chest x-ray showed no acute abnormality - Patient is now ambulating without any difficulty, currently satting in 90s on room air    PVD (peripheral vascular disease) (HCC) - Continue aspirin, asymptomatic    HTN, goal below 130/80 - Stable, continue losartan, amlodipine   Mild acute on CKD (chronic kidney disease), stage III - Serum creatinine was elevated postsurgery due to hypoperfusion, now normalized, back to baseline   Day of Discharge BP (!) 148/70 (BP Location: Right Arm)  Pulse 80   Temp 98.5 F (36.9 C) (Oral)   Resp 17   Ht 6\' 1"  (1.854 m)   Wt 82.1 kg (181 lb)   SpO2 90%   BMI 23.88 kg/m   Physical Exam: General: Alert and awake oriented x3 not in any acute distress. HEENT: anicteric sclera, pupils reactive to light and accommodation CVS: S1-S2 clear no murmur rubs or gallops Chest: clear to auscultation bilaterally, no wheezing rales or rhonchi Abdomen: soft nontender, nondistended, normal bowel  sounds Extremities: no cyanosis, clubbing or edema noted bilaterally Neuro: Cranial nerves II-XII intact, no focal neurological deficits   The results of significant diagnostics from this hospitalization (including imaging, microbiology, ancillary and laboratory) are listed below for reference.    LAB RESULTS: Basic Metabolic Panel:  Recent Labs Lab 02/28/17 0332 03/01/17 0625  NA 135 140  K 3.4* 3.7  CL 103 109  CO2 27 25  GLUCOSE 103* 106*  BUN 15 10  CREATININE 1.49* 1.30*  CALCIUM 8.3* 8.4*   Liver Function Tests:  Recent Labs Lab 02/26/17 0343  AST 26  ALT 22  ALKPHOS 101  BILITOT 0.9  PROT 7.3  ALBUMIN 3.8   No results for input(s): LIPASE, AMYLASE in the last 168 hours. No results for input(s): AMMONIA in the last 168 hours. CBC:  Recent Labs Lab 03/01/17 0625 03/02/17 0448  WBC 7.8 6.9  NEUTROABS  --  4.3  HGB 8.8* 8.5*  HCT 30.1* 29.7*  MCV 78.6 78.6  PLT 245 256   Cardiac Enzymes: No results for input(s): CKTOTAL, CKMB, CKMBINDEX, TROPONINI in the last 168 hours. BNP: Invalid input(s): POCBNP CBG: No results for input(s): GLUCAP in the last 168 hours.  Significant Diagnostic Studies:  No results found.  2D ECHO:   Disposition and Follow-up: Discharge Instructions    Diet - low sodium heart healthy    Complete by:  As directed    Discharge instructions    Complete by:  As directed    Low-fat diet.  No sports or heavy lifting for 3 weeks   Increase activity slowly    Complete by:  As directed        DISPOSITION: Home   DISCHARGE FOLLOW-UP Follow-up Information    [facility] [place] [place] 3 week(s).   Specialty:  General Surgery Why:  Your appointment is 03/11/17 at 2:45PM. Please arrive 30 minutes prior to your appointment to check in and fill out necessary paperwork. Contact information: [address] [place] [phone]       Avis Epley'Meara, Thomas, MD. Schedule an  appointment as soon as possible for a visit in 2 week(s).   Specialty:  Family Medicine           Time spent on Discharge: 35mins   Signed:   Thad Rangeripudeep Rai M.D. [facility] 03/03/2017, 11:58 AM Pager: [phone]

## 2017-03-03 NOTE — Progress Notes (Signed)
SATURATION QUALIFICATIONS: (This note is used to comply with regulatory documentation for home oxygen)  Patient Saturations on Room Air at Rest = 95%  Patient Saturations on Room Air while Ambulating = 92%  Patient Saturations on 0 Liters of oxygen while Ambulating = 92%  Please briefly explain why patient needs home oxygen: Pt is ambulating with steady gait at room air, denies SOB.

## 2018-07-30 ENCOUNTER — Emergency Department (HOSPITAL_COMMUNITY)
Admission: EM | Admit: 2018-07-30 | Discharge: 2018-07-30 | Disposition: A | Discharge status: Home / Self Care | Payer: Medicare Other | Attending: Emergency Medicine | Admitting: Emergency Medicine

## 2018-07-30 ENCOUNTER — Emergency Department (HOSPITAL_COMMUNITY): Payer: Medicare Other

## 2018-07-30 ENCOUNTER — Encounter (HOSPITAL_COMMUNITY): Payer: Self-pay | Admitting: *Deleted

## 2018-07-30 ENCOUNTER — Other Ambulatory Visit: Payer: Self-pay

## 2018-07-30 DIAGNOSIS — I129 Hypertensive chronic kidney disease with stage 1 through stage 4 chronic kidney disease, or unspecified chronic kidney disease: Secondary | ICD-10-CM | POA: Insufficient documentation

## 2018-07-30 DIAGNOSIS — Z79899 Other long term (current) drug therapy: Secondary | ICD-10-CM | POA: Insufficient documentation

## 2018-07-30 DIAGNOSIS — R1031 Right lower quadrant pain: Secondary | ICD-10-CM | POA: Diagnosis present

## 2018-07-30 DIAGNOSIS — N183 Chronic kidney disease, stage 3 (moderate): Secondary | ICD-10-CM | POA: Insufficient documentation

## 2018-07-30 DIAGNOSIS — N200 Calculus of kidney: Secondary | ICD-10-CM | POA: Diagnosis not present

## 2018-07-30 DIAGNOSIS — Z7982 Long term (current) use of aspirin: Secondary | ICD-10-CM | POA: Diagnosis not present

## 2018-07-30 DIAGNOSIS — Z87891 Personal history of nicotine dependence: Secondary | ICD-10-CM | POA: Insufficient documentation

## 2018-07-30 LAB — COMPREHENSIVE METABOLIC PANEL
ALBUMIN: 3.4 g/dL — AB (ref 3.5–5.0)
ALK PHOS: 93 U/L (ref 38–126)
ALT: 24 U/L (ref 0–44)
ANION GAP: 10 (ref 5–15)
AST: 24 U/L (ref 15–41)
BUN: 16 mg/dL (ref 8–23)
CALCIUM: 8.6 mg/dL — AB (ref 8.9–10.3)
CHLORIDE: 107 mmol/L (ref 98–111)
CO2: 24 mmol/L (ref 22–32)
Creatinine, Ser: 1.5 mg/dL — ABNORMAL HIGH (ref 0.61–1.24)
GFR calc non Af Amer: 46 mL/min — ABNORMAL LOW (ref >60)
GFR, EST AFRICAN AMERICAN: 53 mL/min — AB (ref >60)
GLUCOSE: 122 mg/dL — AB (ref 70–99)
Potassium: 3.5 mmol/L (ref 3.5–5.1)
Sodium: 141 mmol/L (ref 135–145)
Total Bilirubin: 0.4 mg/dL (ref 0.3–1.2)
Total Protein: 7 g/dL (ref 6.5–8.1)

## 2018-07-30 LAB — CBC
HEMATOCRIT: 39.6 % (ref 39.0–52.0)
Hemoglobin: 11.6 g/dL — ABNORMAL LOW (ref 13.0–17.0)
MCH: 24.3 pg — AB (ref 26.0–34.0)
MCHC: 29.3 g/dL — ABNORMAL LOW (ref 30.0–36.0)
MCV: 83 fL (ref 80.0–100.0)
NRBC: 0 % (ref 0.0–0.2)
Platelets: 287 10*3/uL (ref 150–400)
RBC: 4.77 MIL/uL (ref 4.22–5.81)
RDW: 14.6 % (ref 11.5–15.5)
WBC: 7.6 10*3/uL (ref 4.0–10.5)

## 2018-07-30 LAB — URINALYSIS, ROUTINE W REFLEX MICROSCOPIC
Bilirubin Urine: NEGATIVE
GLUCOSE, UA: NEGATIVE mg/dL
HGB URINE DIPSTICK: NEGATIVE
Ketones, ur: NEGATIVE mg/dL
LEUKOCYTES UA: NEGATIVE
Nitrite: NEGATIVE
PH: 6 (ref 5.0–8.0)
Protein, ur: NEGATIVE mg/dL
Specific Gravity, Urine: 1.017 (ref 1.005–1.030)

## 2018-07-30 LAB — LIPASE, BLOOD: LIPASE: 30 U/L (ref 11–51)

## 2018-07-30 MED ORDER — TAMSULOSIN HCL 0.4 MG PO CAPS: 0.4000 mg | ORAL_CAPSULE | Freq: Every day | ORAL | 0 refills | Status: AC

## 2018-07-30 MED ORDER — HYDROMORPHONE HCL 1 MG/ML IJ SOLN
0.5000 mg | Freq: Once | INTRAMUSCULAR | Status: AC
  Administered 2018-07-30: 0.5 mg via INTRAVENOUS
  Filled 2018-07-30: qty 1

## 2018-07-30 MED ORDER — FENTANYL CITRATE (PF) 100 MCG/2ML IJ SOLN
50.0000 ug | Freq: Once | INTRAMUSCULAR | Status: AC
  Administered 2018-07-30: 50 ug via INTRAVENOUS
  Filled 2018-07-30: qty 2

## 2018-07-30 MED ORDER — PROMETHAZINE HCL 25 MG/ML IJ SOLN
12.5000 mg | Freq: Once | INTRAMUSCULAR | Status: AC
  Administered 2018-07-30: 12.5 mg via INTRAVENOUS
  Filled 2018-07-30: qty 1

## 2018-07-30 MED ORDER — KETOROLAC TROMETHAMINE 30 MG/ML IJ SOLN
15.0000 mg | Freq: Once | INTRAMUSCULAR | Status: AC
  Administered 2018-07-30: 15 mg via INTRAVENOUS
  Filled 2018-07-30: qty 1

## 2018-07-30 MED ORDER — HYDROMORPHONE HCL 1 MG/ML IJ SOLN: 0.5000 mg | Freq: Once | INTRAMUSCULAR | Status: DC

## 2018-07-30 MED ORDER — HYDROMORPHONE HCL 1 MG/ML IJ SOLN: 1.0000 mg | Freq: Once | INTRAMUSCULAR | Status: DC

## 2018-07-30 MED ORDER — ONDANSETRON HCL 4 MG/2ML IJ SOLN
4.0000 mg | Freq: Once | INTRAMUSCULAR | Status: AC
  Administered 2018-07-30: 4 mg via INTRAVENOUS
  Filled 2018-07-30: qty 2

## 2018-07-30 MED ORDER — SODIUM CHLORIDE 0.9 % IV BOLUS
500.0000 mL | Freq: Once | INTRAVENOUS | Status: AC
  Administered 2018-07-30: 500 mL via INTRAVENOUS

## 2018-07-30 MED ORDER — HYDROCODONE-ACETAMINOPHEN 5-325 MG PO TABS: 1.0000 | ORAL_TABLET | Freq: Four times a day (QID) | ORAL | 0 refills | Status: AC | PRN

## 2018-07-30 MED ORDER — ONDANSETRON 4 MG PO TBDP: 4.0000 mg | ORAL_TABLET | Freq: Three times a day (TID) | ORAL | 0 refills | Status: AC | PRN

## 2018-07-30 MED ORDER — OXYCODONE-ACETAMINOPHEN 5-325 MG PO TABS
1.0000 | ORAL_TABLET | Freq: Once | ORAL | Status: AC
  Administered 2018-07-30: 1 via ORAL
  Filled 2018-07-30: qty 1

## 2018-07-30 NOTE — Discharge Instructions (Addendum)
°  Kidney Stone There is evidence of a kidney stone on the right side.  It appears as though it is on its way out.  Some kidney stones can take up to 30 days to pass. Hydration: Hydration is key to helping a kidney stone pass.  Have a goal of half a liter of water every hour or two. Acetaminophen: May take acetaminophen (generic for Tylenol), as needed, for pain. Your daily total maximum amount of acetaminophen from all sources should be limited to 4000mg /day for persons without liver problems, or 2000mg /day for those with liver problems. Vicodin: May take Vicodin (hydrocodone-acetaminophen) as needed for severe pain.  Do not drive or perform other dangerous activities while taking the Vicodin.  Please note that each pill of Vicodin contains 325 mg of acetaminophen (Tylenol) and the above dosage limits apply. Tamsulosin: This medication is designed to help the stone pass.  Take this medication daily until stone passes. Zofran: Use of Zofran, as needed, for nausea/vomiting. Follow-up: Follow-up with the urologist as soon as possible on this matter.  For prescription assistance, may try using prescription discount sites or apps, such as goodrx.com

## 2018-07-30 NOTE — ED Notes (Signed)
The pt  Having less pain now  sats still low intermittently  Pt alert no distress

## 2018-07-30 NOTE — ED Notes (Signed)
Pt verbalized understanding of d/c instructions and has no further questions. [facility]. [facility]. VSS, NAD. Pt's sats were 95-96% consistently on RA prior to discharge. Denies shob and denies pain.

## 2018-07-30 NOTE — ED Provider Notes (Signed)
[facility] EMERGENCY DEPARTMENT Provider Note   CSN: [id number] Arrival date & time: 07/30/18  4132     History   Chief Complaint Chief Complaint  Patient presents with  . Abdominal Pain    HPI David Horne is a 72 y.o. male.   72 year old male with a history of CAD, dyslipidemia, chronic kidney disease presents to the emergency department for evaluation of right lower quadrant abdominal pain which woke him from sleep at 2 AM.  States that pain began suddenly and has been constant.  He currently rates it at 8/10.  Notes severity to be waxing and waning without modifying factors.  He had one episode of vomiting prior to arrival secondary to severe pain.  Denies taking any medications prior to arrival.  Has not had any fevers, dysuria, hematuria, bowel changes.  Abdominal surgical history significant for cholecystectomy.  Reports being told he had stones in his kidney after CT imaging at the [place]; has never passed any kidney stones.     Past Medical History:  Diagnosis Date  . Anginal chest pain at rest [facility]) 12/03/2016   a. 11/2016: cath showing minimal CAD with normal EF.   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 Atherosclerosis of native artery of right lower extremity with intermittent claudication (HCC)   . CKD (chronic kidney disease), stage III (HCC)   . Dyslipidemia 02/06/2013  . Esophageal reflux   . History of stomach ulcers    "took RX and it went away"  . Hypertension   . PAD (peripheral artery disease) [facility])     Patient Active Problem List   Diagnosis Date Noted  . Hypoxia   . SOB (shortness of breath)   . Acute cholecystitis 02/25/2017  . Angina at rest [facility]) 12/04/2016  . HTN, goal below 130/80 12/03/2016  . Anginal chest pain at rest [facility]) 12/03/2016  . Unstable angina (HCC) 12/03/2016  . CKD (chronic kidney disease), stage III (HCC)   . 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 PVD (peripheral vascular  disease) (HCC) 08/06/2014  . PAD (peripheral artery disease) (HCC) 08/14/2013  . Dyslipidemia 02/06/2013    Past Surgical History:  Procedure Laterality Date  . ABDOMINAL AORTAGRAM N/A 08/06/2014   Procedure: ABDOMINAL Ronny Flurry;  Surgeon: Chuck Hint, MD;  Location: [facility] CATH LAB;  Service: Cardiovascular;  Laterality: N/A;  . CHOLECYSTECTOMY N/A 02/26/2017   Procedure: LAPAROSCOPIC CHOLECYSTECTOMY;  Surgeon: Emelia Loron, MD;  Location: [facility] OR;  Service: General;  Laterality: N/A;  . COLONOSCOPY W/ BIOPSIES AND POLYPECTOMY    . EXCISIONAL HEMORRHOIDECTOMY    . FEMORAL ARTERY - FEMORAL ARTERY BYPASS GRAFT  2005   Left to Right   . FEMORAL-FEMORAL BYPASS GRAFT Bilateral 09/10/2014   Procedure: REDO BYPASS GRAFT LEFT FEMORAL-RIGHT FEMORAL ARTERY;  Surgeon: Larina Earthly, MD;  Location: [facility] OR;  Service: Vascular;  Laterality: Bilateral;  . LEFT HEART CATH AND CORONARY ANGIOGRAPHY N/A 12/04/2016   Procedure: Left Heart Cath and Coronary Angiography;  Surgeon: Corky Crafts, MD;  Location: [facility] INVASIVE CV LAB;  Service: Cardiovascular;  Laterality: N/A;        Home Medications    Prior to Admission medications   Medication Sig Start Date End Date Taking? Authorizing Provider  amLODipine (NORVASC) 10 MG tablet Take 10 mg by mouth daily.   Yes [provider]  ascorbic acid (VITAMIN C) 1000 MG tablet Take 1,000 mg by mouth daily.   Yes [provider]  aspirin 81 MG tablet Take 81 mg by mouth daily.   Yes [provider]  losartan (COZAAR) 100 MG tablet Take 100 mg by mouth daily.    Yes [provider]  Multiple Vitamins-Minerals (CENTRUM SILVER PO) Take 1 tablet by mouth daily.    Yes [provider]  Omega-3 Fatty Acids (FISH OIL) 1000 MG CAPS Take 2,000 mg by mouth 2 (two) times daily.    Yes [provider]  omeprazole (PRILOSEC) 20 MG capsule Take 20 mg by mouth daily.   Yes [provider]  acetaminophen  (TYLENOL) 500 MG tablet Take 1 tablet (500 mg total) by mouth every 6 (six) hours as needed for moderate pain or fever. Patient not taking: Reported on 07/30/2018 03/03/17   Rai, Delene Ruffiniipudeep K, MD  ondansetron (ZOFRAN ODT) 4 MG disintegrating tablet Take 1 tablet (4 mg total) by mouth every 8 (eight) hours as needed for nausea or vomiting. Patient not taking: Reported on 07/30/2018 03/03/17   Cathren Harshai, Ripudeep K, MD    Family History Family History  Problem Relation Age of Onset  . Deep vein thrombosis Sister        Blood Clots  . CVA Mother   . Heart disease Mother     Social History Social History   Tobacco Use  . Smoking status: Former Smoker    Packs/day: 1.00    Years: 43.00    Pack years: 43.00    Types: Cigarettes    Last attempt to quit: 2002    Years since quitting: 17.9  . Smokeless tobacco: Never Used  Substance Use Topics  . Alcohol use: Yes    Alcohol/week: 0.0 standard drinks    Comment: 12/03/2016 "stopped 04/27/1977"  . Drug use: No     Allergies   Patient has no known allergies.   Review of Systems Review of Systems Ten systems reviewed and are negative for acute change, except as noted in the HPI.    Physical Exam Updated Vital Signs BP (!) 161/86   Pulse 92   Temp 97.6 F (36.4 C) (Oral)   Resp 14   SpO2 94%   Physical Exam  Constitutional: He is oriented to person, place, and time. He appears well-developed and well-nourished. No distress.  Nontoxic appearing, pleasant.  HENT:  Head: Normocephalic and atraumatic.  Eyes: Conjunctivae and EOM are normal. No scleral icterus.  Neck: Normal range of motion.  Cardiovascular: Normal rate, regular rhythm and intact distal pulses.  Pulmonary/Chest: Effort normal. No stridor. No respiratory distress.  Respirations even and unlabored  Abdominal: Soft. He exhibits no mass. There is tenderness. There is no guarding.  TTP in the RLQ. Mild voluntary guarding. No masses or peritoneal signs.  Musculoskeletal:  Normal range of motion.  Neurological: He is alert and oriented to person, place, and time. He exhibits normal muscle tone. Coordination normal.  Skin: Skin is warm and dry. No rash noted. He is not diaphoretic. No erythema. No pallor.  Psychiatric: He has a normal mood and affect. His behavior is normal.  Nursing note and vitals reviewed.    ED Treatments / Results  Labs (all labs ordered are listed, but only abnormal results are displayed) Labs Reviewed  COMPREHENSIVE METABOLIC PANEL - Abnormal; Notable for the following components:      Result Value   Glucose, Bld 122 (*)    Creatinine, Ser 1.50 (*)    Calcium 8.6 (*)    Albumin 3.4 (*)    GFR  calc non Af Amer 46 (*)    GFR calc Af Amer 53 (*)    All other components within normal limits  CBC - Abnormal; Notable for the following components:   Hemoglobin 11.6 (*)    MCH 24.3 (*)    MCHC 29.3 (*)    All other components within normal limits  LIPASE, BLOOD  URINALYSIS, ROUTINE W REFLEX MICROSCOPIC    EKG EKG Interpretation  Date/Time:  Saturday July 30 2018 03:10:40 EST Ventricular Rate:  86 PR Interval:    QRS Duration: 81 QT Interval:  372 QTC Calculation: 445 R Axis:   60 Text Interpretation:  Sinus rhythm No significant change was found Confirmed by Glynn Octave [phone]) on 07/30/2018 3:32:07 AM   Radiology Ct Renal Stone Study  Result Date: 07/30/2018 CLINICAL DATA:  Right flank pain.  History of stone. EXAM: CT ABDOMEN AND PELVIS WITHOUT CONTRAST TECHNIQUE: Multidetector CT imaging of the abdomen and pelvis was performed following the standard protocol without IV contrast. COMPARISON:  None. FINDINGS: Lower chest: Emphysema. There are at least 4 pulmonary nodules of the lung bases in the right lower lobe, right middle lobe, and left lower lobe. These are subpleural in location and measure 5-6 mm. Calcified granuloma in the right middle lobe. Hepatobiliary: Multiple low-density lesions throughout both lobes  of the liver. Majority of these are subcentimeter and too small to characterize. Largest lesion in the subcapsular right lobe measures 16 mm and represents a simple cyst. Punctate hepatic granuloma. Prior cholecystectomy without biliary dilatation. Pancreas: Fatty atrophy.  No ductal dilatation or inflammation. Spleen: Normal in size without focal abnormality. Adrenals/Urinary Tract: Normal adrenal glands. Mild right hydroureteronephrosis. There is a 3 mm stone either at or just beyond the right ureterovesicular junction. No additional ureteral calculi. Additional 6 mm stone in the right mid kidney. No left hydronephrosis. No left urolithiasis. Bilateral renal parenchymal thinning and perinephric edema is likely chronic. Urinary bladder is partially distended. Stomach/Bowel: Colonic diverticulosis, prominent in the sigmoid colon. No diverticulitis. No bowel wall thickening, inflammatory change, or obstruction. Appendix tentatively identified and normal. Vascular/Lymphatic: Aortic and branch atherosclerosis. No aortic aneurysm. Vascular stents in bilateral iliac arteries. Right external iliac artery is diminutive. Fem-fem bypass graft partially included. Cannot assess vascular patency without IV contrast. No adenopathy. Reproductive: Prostate is unremarkable. Other: Tiny fat containing umbilical hernia. No free air, free fluid, or intra-abdominal fluid collection. Musculoskeletal: There are no acute or suspicious osseous abnormalities. IMPRESSION: 1. Mild right hydroureteronephrosis. A 3 mm stone is either at or just beyond the right ureterovesicular junction, likely cause of obstruction. 2. Additional nonobstructing right renal stone. 3. At least 4 pulmonary nodules in the lung bases, largest 6 mm. No prior exams available for comparison. Non-contrast chest CT at 3-6 months is recommended. If the nodules are stable at time of repeat CT, then future CT at 18-24 months (from today's scan) is considered optional for  low-risk patients, but is recommended for high-risk patients. This recommendation follows the consensus statement: Guidelines for Management of Incidental Pulmonary Nodules Detected on CT Images: From the Fleischner Society 2017; Radiology 2017; 284:228-243. 4. Low-density hepatic lesions, largest representing simple cyst. The majority are too small to accurately characterize but likely additional cysts. 5. Incidental findings of colonic diverticulosis without diverticulitis, Aortic Atherosclerosis (ICD10-I70.0), prior vascular stenting of the iliac arteries and fem-fem bypass graft. Electronically Signed   By: Narda Rutherford M.D.   On: 07/30/2018 04:35    Procedures Procedures (including critical care time)  Medications  Ordered in ED Medications  ondansetron (ZOFRAN) injection 4 mg (4 mg Intravenous Given 07/30/18 0330)  sodium chloride 0.9 % bolus 500 mL (0 mLs Intravenous Stopped 07/30/18 0443)  fentaNYL (SUBLIMAZE) injection 50 mcg (50 mcg Intravenous Given 07/30/18 0331)  promethazine (PHENERGAN) injection 12.5 mg (12.5 mg Intravenous Given 07/30/18 0429)  HYDROmorphone (DILAUDID) injection 0.5 mg (0.5 mg Intravenous Given 07/30/18 0430)  ketorolac (TORADOL) 30 MG/ML injection 15 mg (15 mg Intravenous Given 07/30/18 0521)  oxyCODONE-acetaminophen (PERCOCET/ROXICET) 5-325 MG per tablet 1 tablet (1 tablet Oral Given 07/30/18 0523)     Initial Impression / Assessment and Plan / ED Course  I have reviewed the triage vital signs and the nursing notes.  Pertinent labs & imaging results that were available during my care of the patient were reviewed by me and considered in my medical decision making (see chart for details).    32:70 AM 72 year old male presents to the emergency department for evaluation of right lower quadrant abdominal pain.  Symptoms were sudden in onset, waking him from sleep.  He had one episode of vomiting prior to arrival.  Noted to have focal right lower quadrant  tenderness on exam.  No peritoneal signs.  Pending labs and CT renal stone study.  4:15 AM Per RN, patient continues to have pain.  Has started vomiting despite Zofran due to worsening pain.  Will give 0.5 mg Dilaudid as well as 12.5 mg Phenergan.  5:12 AM Patient reassessed.  His pain is rated at 6/10.  He continues to visibly appear uncomfortable.  Will try 15 mg Toradol IV in addition to previously administered meds.  1 tablet of Percocet ordered.  CT findings appear consistent with right 3 mm UVJ calculus.  Also signs of right-sided hydronephrosis.  No other acute pathology in the abdomen or pelvis.  6:00 AM States pain is presently at 5/10. Sats continue to be borderline low due to use of opioids. Will continue on supplemental O2 via nasal cannula. Patient does appear more comfortable.  6:29 AM Patient signed out to Harolyn Rutherford, PA-C at change of shift.  Will monitor patient's pain level.  If pain is well controlled, believe he is stable for discharge; however, he will first need to be weaned off supplemental oxygen.   Final Clinical Impressions(s) / ED Diagnoses   Final diagnoses:  Kidney stone on right side    ED Discharge Orders    None       Antony Madura, PA-C 07/30/18 0631    Glynn Octave, MD 07/30/18 [phone]

## 2018-07-30 NOTE — ED Triage Notes (Signed)
The pt has had rt abd pain since 0200am  Sudden onset  Nausea with the pain  No past history

## 2018-07-30 NOTE — ED Provider Notes (Signed)
David Horne is a 72 y.o. male presenting to the ED with abdominal pain.   HPI from Antony Madura, PA-C: "72 year old male with a history of CAD, dyslipidemia, chronic kidney disease presents to the emergency department for evaluation of right lower quadrant abdominal pain which woke him from sleep at 2 AM.  States that pain began suddenly and has been constant.  He currently rates it at 8/10.  Notes severity to be waxing and waning without modifying factors.  He had one episode of vomiting prior to arrival secondary to severe pain.  Denies taking any medications prior to arrival.  Has not had any fevers, dysuria, hematuria, bowel changes.  Abdominal surgical history significant for cholecystectomy.  Reports being told he had stones in his kidney after CT imaging at the [place]; has never passed any kidney stones."  Past Medical History:  Diagnosis Date  . Anginal chest pain at rest [facility]) 12/03/2016   a. 11/2016: cath showing minimal CAD with normal EF.   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 Atherosclerosis of native artery of right lower extremity with intermittent claudication (HCC)   . CKD (chronic kidney disease), stage III (HCC)   . Dyslipidemia 02/06/2013  . Esophageal reflux   . History of stomach ulcers    "took RX and it went away"  . Hypertension   . PAD (peripheral artery disease) (HCC)     Physical Exam  BP (!) 161/86   Pulse 92   Temp 97.6 F (36.4 C) (Oral)   Resp 14   SpO2 94%   Physical Exam  Constitutional: He appears well-developed and well-nourished. No distress.  HENT:  Head: Normocephalic and atraumatic.  Eyes: Conjunctivae are normal.  Neck: Neck supple.  Cardiovascular: Normal rate and regular rhythm.  Pulmonary/Chest: Effort normal. No respiratory distress.  Abdominal: Soft. There is no tenderness. There is no guarding.  Musculoskeletal: He exhibits no edema.  Neurological: He is alert.  Skin: Skin is warm and dry. He is not  diaphoretic.  Psychiatric: He has a normal mood and affect. His behavior is normal.  Nursing note and vitals reviewed.   ED Course/Procedures    Procedures   Abnormal Labs Reviewed  COMPREHENSIVE METABOLIC PANEL - Abnormal; Notable for the following components:      Result Value   Glucose, Bld 122 (*)    Creatinine, Ser 1.50 (*)    Calcium 8.6 (*)    Albumin 3.4 (*)    GFR calc non Af Amer 46 (*)    GFR calc Af Amer 53 (*)    All other components within normal limits  CBC - Abnormal; Notable for the following components:   Hemoglobin 11.6 (*)    MCH 24.3 (*)    MCHC 29.3 (*)    All other components within normal limits   Ct Renal Stone Study  Result Date: 07/30/2018 CLINICAL DATA:  Right flank pain.  History of stone. EXAM: CT ABDOMEN AND PELVIS WITHOUT CONTRAST TECHNIQUE: Multidetector CT imaging of the abdomen and pelvis was performed following the standard protocol without IV contrast. COMPARISON:  None. FINDINGS: Lower chest: Emphysema. There are at least 4 pulmonary nodules of the lung bases in the right lower lobe, right middle lobe, and left lower lobe. These are subpleural in location and measure 5-6 mm. Calcified granuloma in the right middle lobe. Hepatobiliary: Multiple low-density lesions throughout both lobes of the liver. Majority of these are subcentimeter and too small to characterize. Largest lesion  in the subcapsular right lobe measures 16 mm and represents a simple cyst. Punctate hepatic granuloma. Prior cholecystectomy without biliary dilatation. Pancreas: Fatty atrophy.  No ductal dilatation or inflammation. Spleen: Normal in size without focal abnormality. Adrenals/Urinary Tract: Normal adrenal glands. Mild right hydroureteronephrosis. There is a 3 mm stone either at or just beyond the right ureterovesicular junction. No additional ureteral calculi. Additional 6 mm stone in the right mid kidney. No left hydronephrosis. No left urolithiasis. Bilateral renal  parenchymal thinning and perinephric edema is likely chronic. Urinary bladder is partially distended. Stomach/Bowel: Colonic diverticulosis, prominent in the sigmoid colon. No diverticulitis. No bowel wall thickening, inflammatory change, or obstruction. Appendix tentatively identified and normal. Vascular/Lymphatic: Aortic and branch atherosclerosis. No aortic aneurysm. Vascular stents in bilateral iliac arteries. Right external iliac artery is diminutive. Fem-fem bypass graft partially included. Cannot assess vascular patency without IV contrast. No adenopathy. Reproductive: Prostate is unremarkable. Other: Tiny fat containing umbilical hernia. No free air, free fluid, or intra-abdominal fluid collection. Musculoskeletal: There are no acute or suspicious osseous abnormalities. IMPRESSION: 1. Mild right hydroureteronephrosis. A 3 mm stone is either at or just beyond the right ureterovesicular junction, likely cause of obstruction. 2. Additional nonobstructing right renal stone. 3. At least 4 pulmonary nodules in the lung bases, largest 6 mm. No prior exams available for comparison. Non-contrast chest CT at 3-6 months is recommended. If the nodules are stable at time of repeat CT, then future CT at 18-24 months (from today's scan) is considered optional for low-risk patients, but is recommended for high-risk patients. This recommendation follows the consensus statement: Guidelines for Management of Incidental Pulmonary Nodules Detected on CT Images: From the Fleischner Society 2017; Radiology 2017; 284:228-243. 4. Low-density hepatic lesions, largest representing simple cyst. The majority are too small to accurately characterize but likely additional cysts. 5. Incidental findings of colonic diverticulosis without diverticulitis, Aortic Atherosclerosis (ICD10-I70.0), prior vascular stenting of the iliac arteries and fem-fem bypass graft. Electronically Signed   By: Narda RutherfordMelanie  Sanford M.D.   On: 07/30/2018 04:35     MDM    Clinical Course as of Jul 31 743  Sat Jul 30, 2018  [id number] Patient reevaluated.  States his pain is well controlled, rated it 1/10. Patient taken off supplemental O2.  After 10 minutes, patient noted to have SPO2 96 to 98% on room air with good waveform. Patient is alert and oriented without drowsiness and is in no apparent distress.   [SJ]    Clinical Course User Index [SJ] Concepcion LivingJoy, Roxie Gueye C, PA-C   Took patient care handoff report from [facility] Humes, [place]-C. Plan: Assure patient's pain is well controlled.  Patient required supplemental O2 due to drop in SPO2 following Dilaudid administration.  Assure patient's SPO2 returns to adequate level without supplemental O2 prior to discharge.   Patient had significant improvement in his pain.  SPO2 returned to normal level while on room air. The patient was given instructions for home care as well as return precautions. Patient voices understanding of these instructions, accepts the plan, and is comfortable with discharge.   Vitals:   07/30/18 0530 07/30/18 0600 07/30/18 0700 07/30/18 0715  BP: (!) 169/84 (!) 161/86 (!) 144/72   Pulse: 96 92 83 84  Resp: 11 14 (!) 9 19  Temp:      TempSrc:      SpO2: 93% 94% 91% 99%         Anselm PancoastJoy, Taelyr Jantz C, PA-C 07/30/18 0744    Glynn Octaveancour, Stephen, MD 07/30/18 [phone]

## 2018-07-30 NOTE — ED Notes (Signed)
Pain severe pain med given  sats dropped to 83  Nasal 02 at 3

## 2019-04-03 IMAGING — CT CT RENAL STONE PROTOCOL
2 of 4 series · 15 of 46 positions shown, 17 images · non-contrast
Comparison: None.

CLINICAL DATA: Right flank pain.  History of stone.

EXAM:
CT ABDOMEN AND PELVIS WITHOUT CONTRAST
TECHNIQUE: Multidetector CT imaging of the abdomen and pelvis was performed
following the standard protocol without IV contrast.

[Series 3: renal stone 5.0 · axial · 0.79mm/px · z∈[+905,+1345]mm · 12 of 98 slices shown, 14 images]
[im 5/98  soft-tissue]
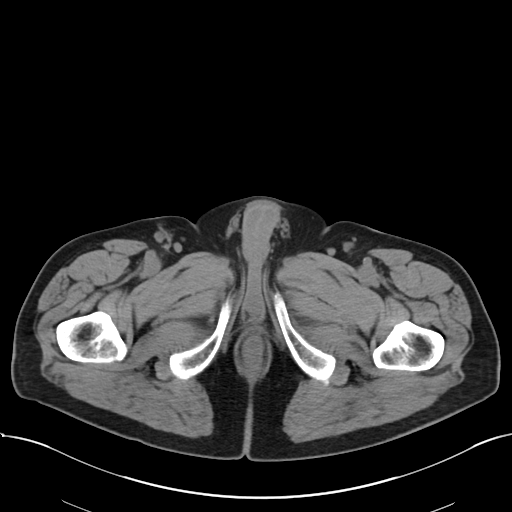
[im 5/98  bone]
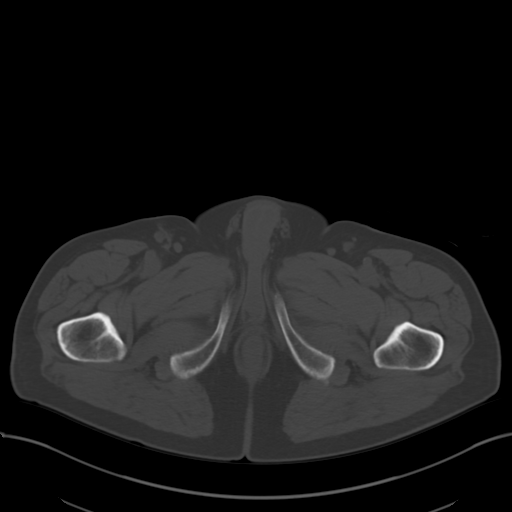
[im 14/98  soft-tissue]
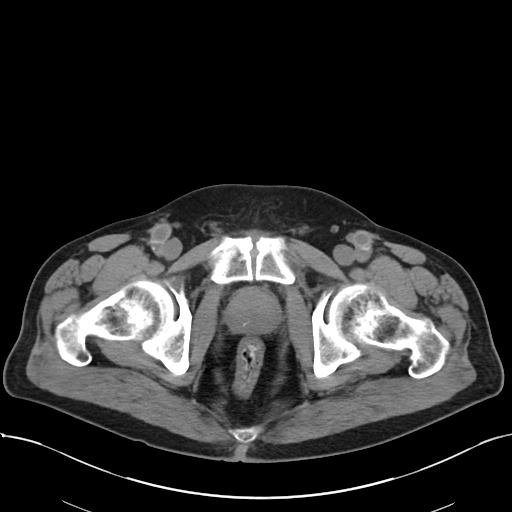
[im 23/98  soft-tissue]
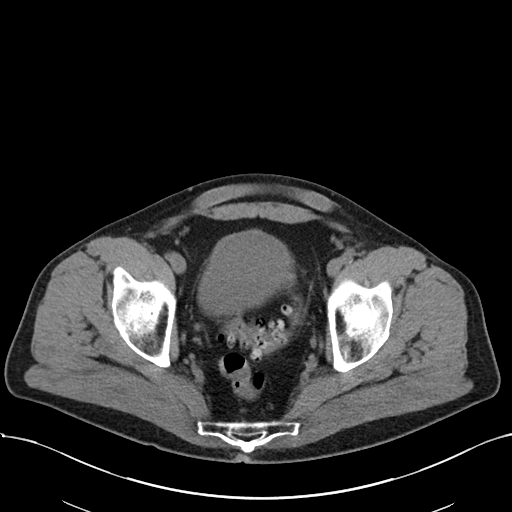
[im 31/98  soft-tissue]
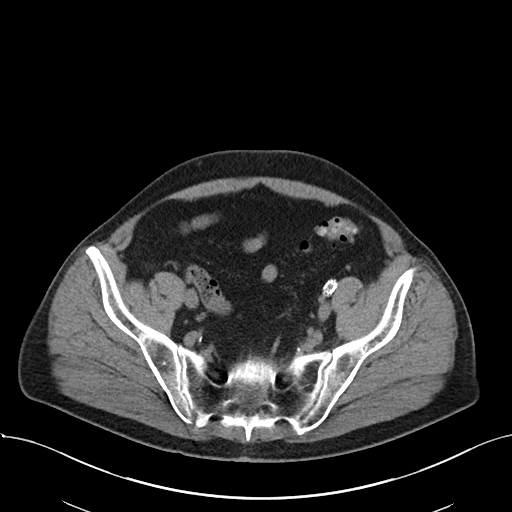
[im 36/98  soft-tissue]
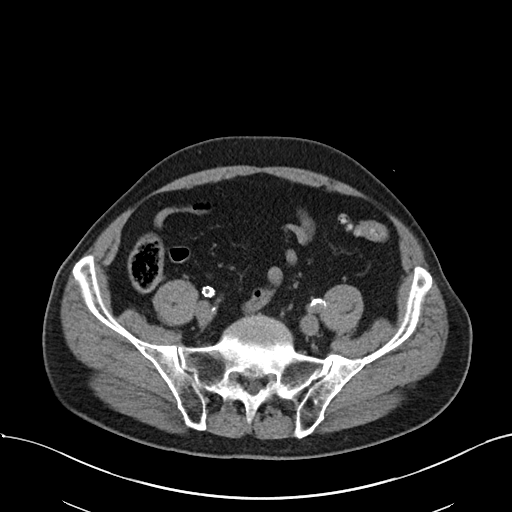
[im 45/98  soft-tissue]
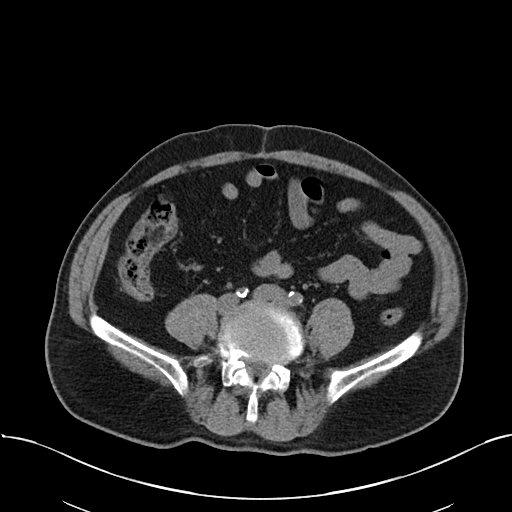
[im 53/98  soft-tissue]
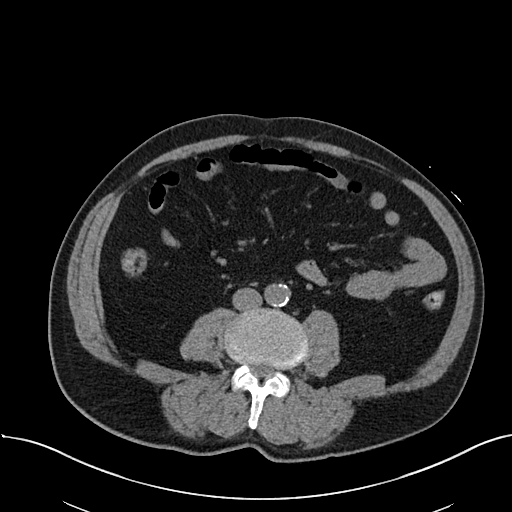
[im 62/98  soft-tissue]
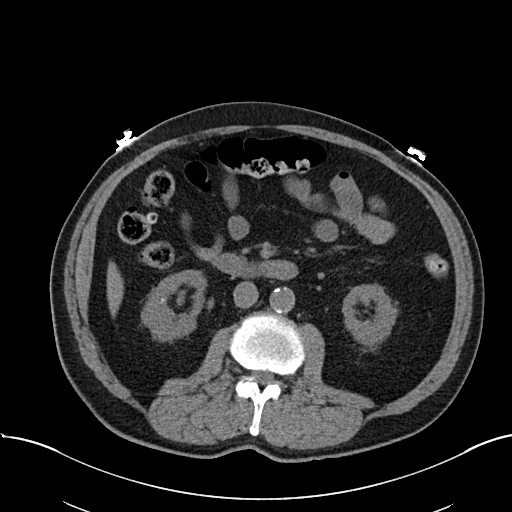
[im 67/98  soft-tissue]
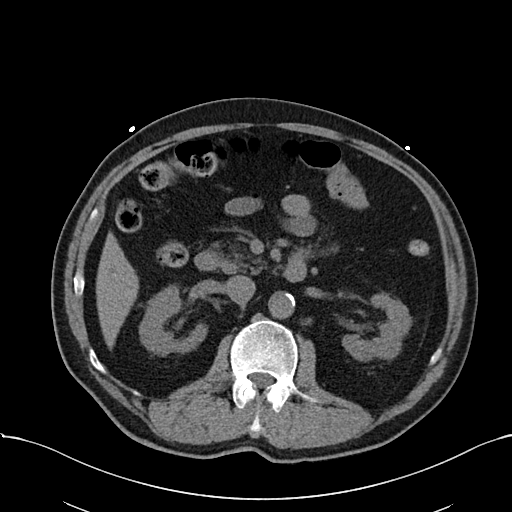
[im 67/98  bone]
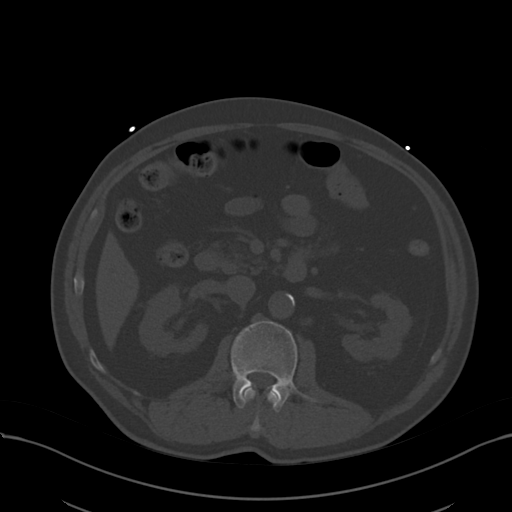
[im 75/98  soft-tissue]
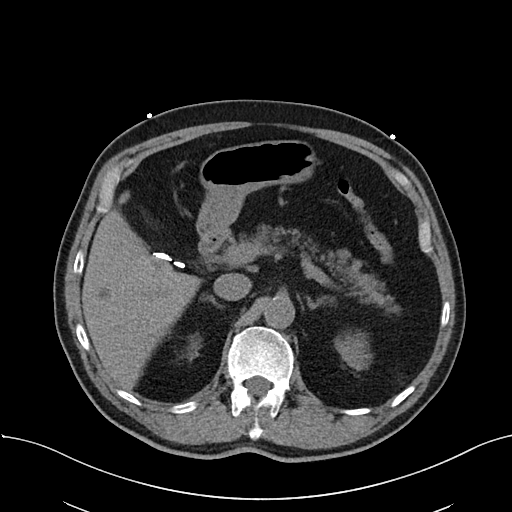
[im 84/98  soft-tissue]
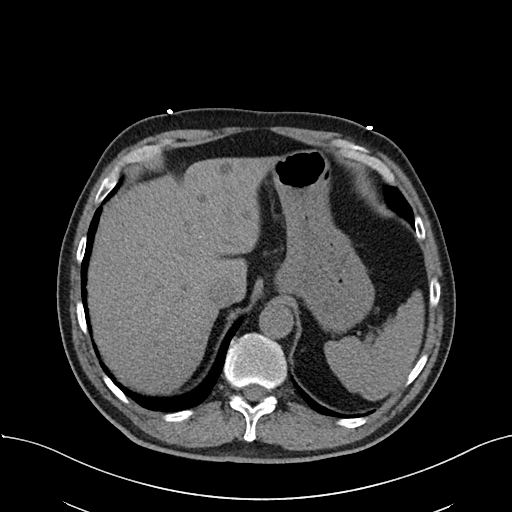
[im 93/98  soft-tissue]
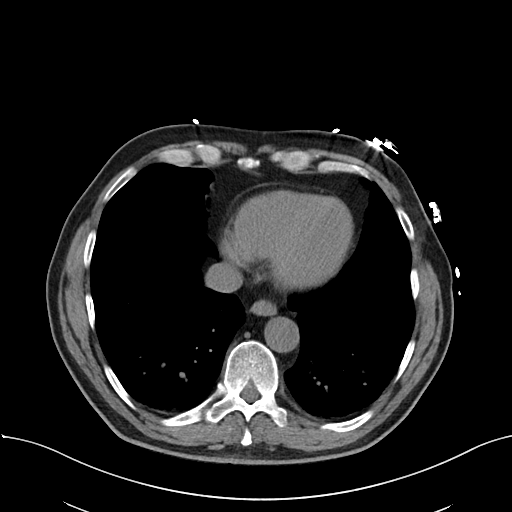

[Series 5: renal stone 3.0 cor · coronal · 0.72mm/px · 3 of 94 slices shown]
[im 32/94  soft-tissue]
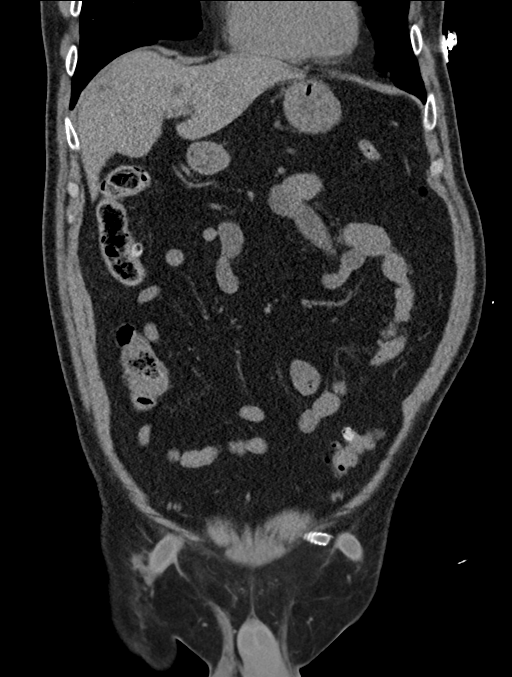
[im 42/94  soft-tissue]
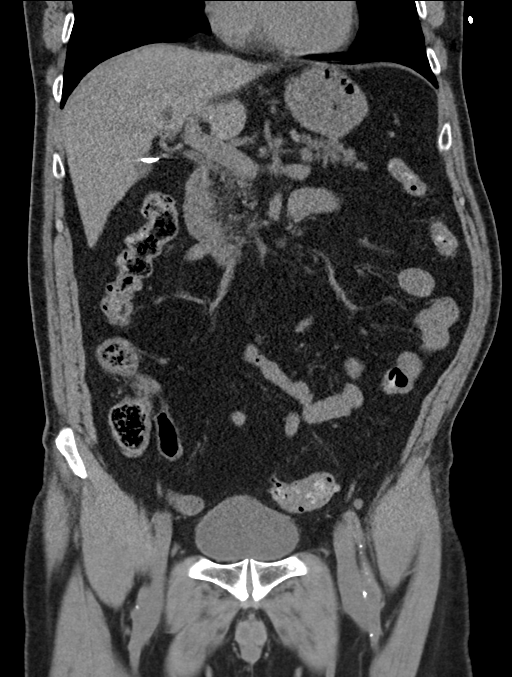
[im 52/94  soft-tissue]
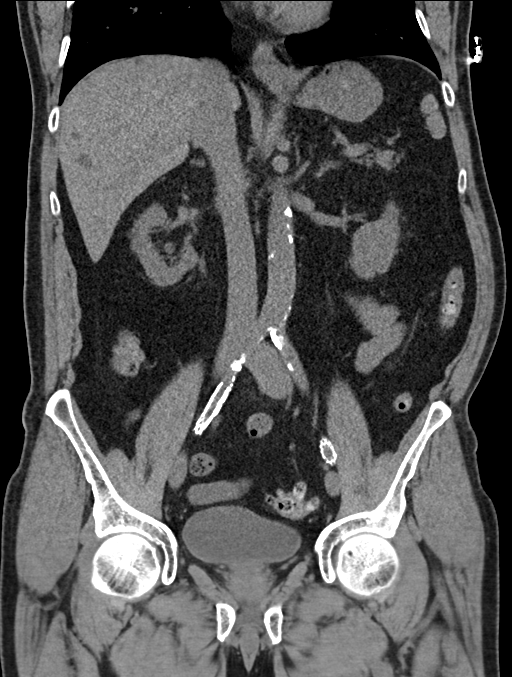

[15 of 46 positions shown; findings below may reference images not displayed]

FINDINGS: Lower chest: Emphysema. There are at least 4 pulmonary nodules of
the lung bases in the right lower lobe, right middle lobe, and left
lower lobe. These are subpleural in location and measure 5-6 mm.
Calcified granuloma in the right middle lobe.

Hepatobiliary: Multiple low-density lesions throughout both lobes of
the liver. Majority of these are subcentimeter and too small to
characterize. Largest lesion in the subcapsular right lobe measures
16 mm and represents a simple cyst. Punctate hepatic granuloma.
Prior cholecystectomy without biliary dilatation.

Pancreas: Fatty atrophy.  No ductal dilatation or inflammation.

Spleen: Normal in size without focal abnormality.

Adrenals/Urinary Tract: Normal adrenal glands. Mild right
hydroureteronephrosis. There is a 3 mm stone either at or just
beyond the right ureterovesicular junction. No additional ureteral
calculi. Additional 6 mm stone in the right mid kidney. No left
hydronephrosis. No left urolithiasis. Bilateral renal parenchymal
thinning and perinephric edema is likely chronic. Urinary bladder is
partially distended.

Stomach/Bowel: Colonic diverticulosis, prominent in the sigmoid
colon. No diverticulitis. No bowel wall thickening, inflammatory
change, or obstruction. Appendix tentatively identified and normal.

Vascular/Lymphatic: Aortic and branch atherosclerosis. No aortic
aneurysm. Vascular stents in bilateral iliac arteries. Right
external iliac artery is diminutive. Fem-fem bypass graft partially
included. Cannot assess vascular patency without IV contrast. No
adenopathy.

Reproductive: Prostate is unremarkable.

Other: Tiny fat containing umbilical hernia. No free air, free
fluid, or intra-abdominal fluid collection.

Musculoskeletal: There are no acute or suspicious osseous
abnormalities.
IMPRESSION: 1. Mild right hydroureteronephrosis. A 3 mm stone is either at or
just beyond the right ureterovesicular junction, likely cause of
obstruction.
2. Additional nonobstructing right renal stone.
3. At least 4 pulmonary nodules in the lung bases, largest 6 mm. No
prior exams available for comparison. Non-contrast chest CT at 3-6
months is recommended. If the nodules are stable at time of repeat
CT, then future CT at 18-24 months (from today's scan) is considered
optional for low-risk patients, but is recommended for high-risk
patients. This recommendation follows the consensus statement:
Guidelines for Management of Incidental Pulmonary Nodules Detected
[DATE].
4. Low-density hepatic lesions, largest representing simple cyst.
The majority are too small to accurately characterize but likely
additional cysts.
5. Incidental findings of colonic diverticulosis without
diverticulitis, Aortic Atherosclerosis ([id number].X), prior vascular
stenting of the iliac arteries and fem-fem bypass graft.
